# Patient Record
Sex: Male | Born: 1955
Health system: Southern US, Community
[De-identification: ages and names within clinical notes are randomized; demographics above are authoritative.]

## PROBLEM LIST (undated history)

## (undated) DIAGNOSIS — F32A Depression, unspecified: Secondary | ICD-10-CM

## (undated) DIAGNOSIS — F419 Anxiety disorder, unspecified: Secondary | ICD-10-CM

## (undated) DIAGNOSIS — F329 Major depressive disorder, single episode, unspecified: Secondary | ICD-10-CM

## (undated) DIAGNOSIS — N289 Disorder of kidney and ureter, unspecified: Secondary | ICD-10-CM

## (undated) HISTORY — PX: SHOULDER SURGERY: SHX246

---

## 2008-02-02 ENCOUNTER — Emergency Department (HOSPITAL_COMMUNITY): Admission: EM | Admit: 2008-02-02 | Discharge: 2008-02-02 | Payer: Self-pay | Admitting: Emergency Medicine

## 2008-02-09 ENCOUNTER — Emergency Department (HOSPITAL_COMMUNITY): Admission: EM | Admit: 2008-02-09 | Discharge: 2008-02-09 | Payer: Self-pay | Admitting: Emergency Medicine

## 2008-09-04 ENCOUNTER — Encounter: Admission: RE | Admit: 2008-09-04 | Discharge: 2008-09-04 | Payer: Self-pay | Admitting: Neurological Surgery

## 2008-11-06 ENCOUNTER — Emergency Department (HOSPITAL_COMMUNITY): Admission: EM | Admit: 2008-11-06 | Discharge: 2008-11-06 | Payer: Self-pay | Admitting: Emergency Medicine

## 2009-02-25 ENCOUNTER — Encounter: Admission: RE | Admit: 2009-02-25 | Discharge: 2009-02-25 | Payer: Self-pay | Admitting: Orthopedic Surgery

## 2009-08-16 ENCOUNTER — Inpatient Hospital Stay (HOSPITAL_COMMUNITY): Admission: RE | Admit: 2009-08-16 | Discharge: 2009-08-18 | Payer: Self-pay | Admitting: Orthopedic Surgery

## 2010-07-15 IMAGING — CR DG CHEST 2V
2 series · 2 of 2 positions shown · non-contrast
Comparison: Chest radiograph performed 02/02/2008

CLINICAL DATA: Drop in O2 saturation to 70s when sleeping.

CHEST - 2 VIEW

[w chest pa]
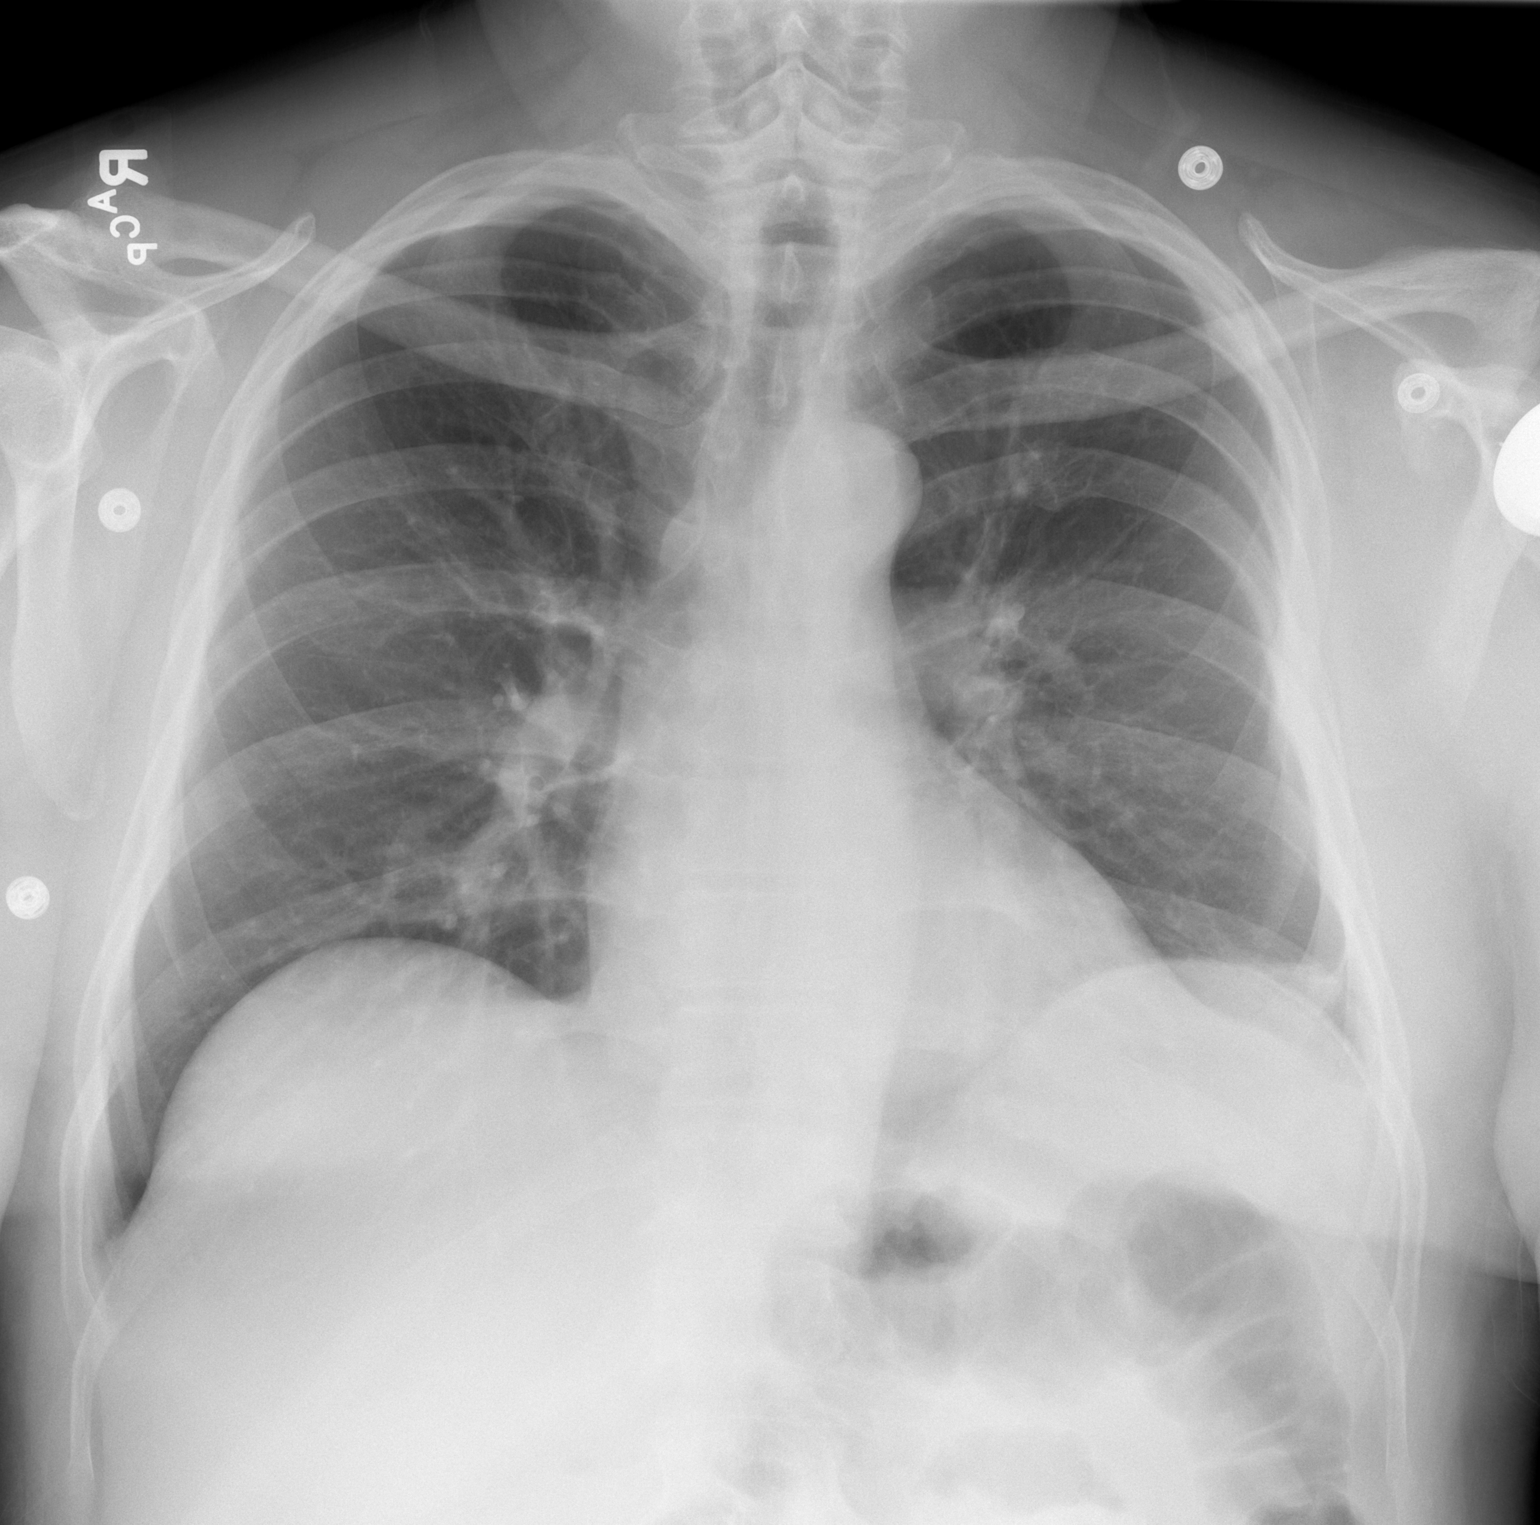

[w chest lat]
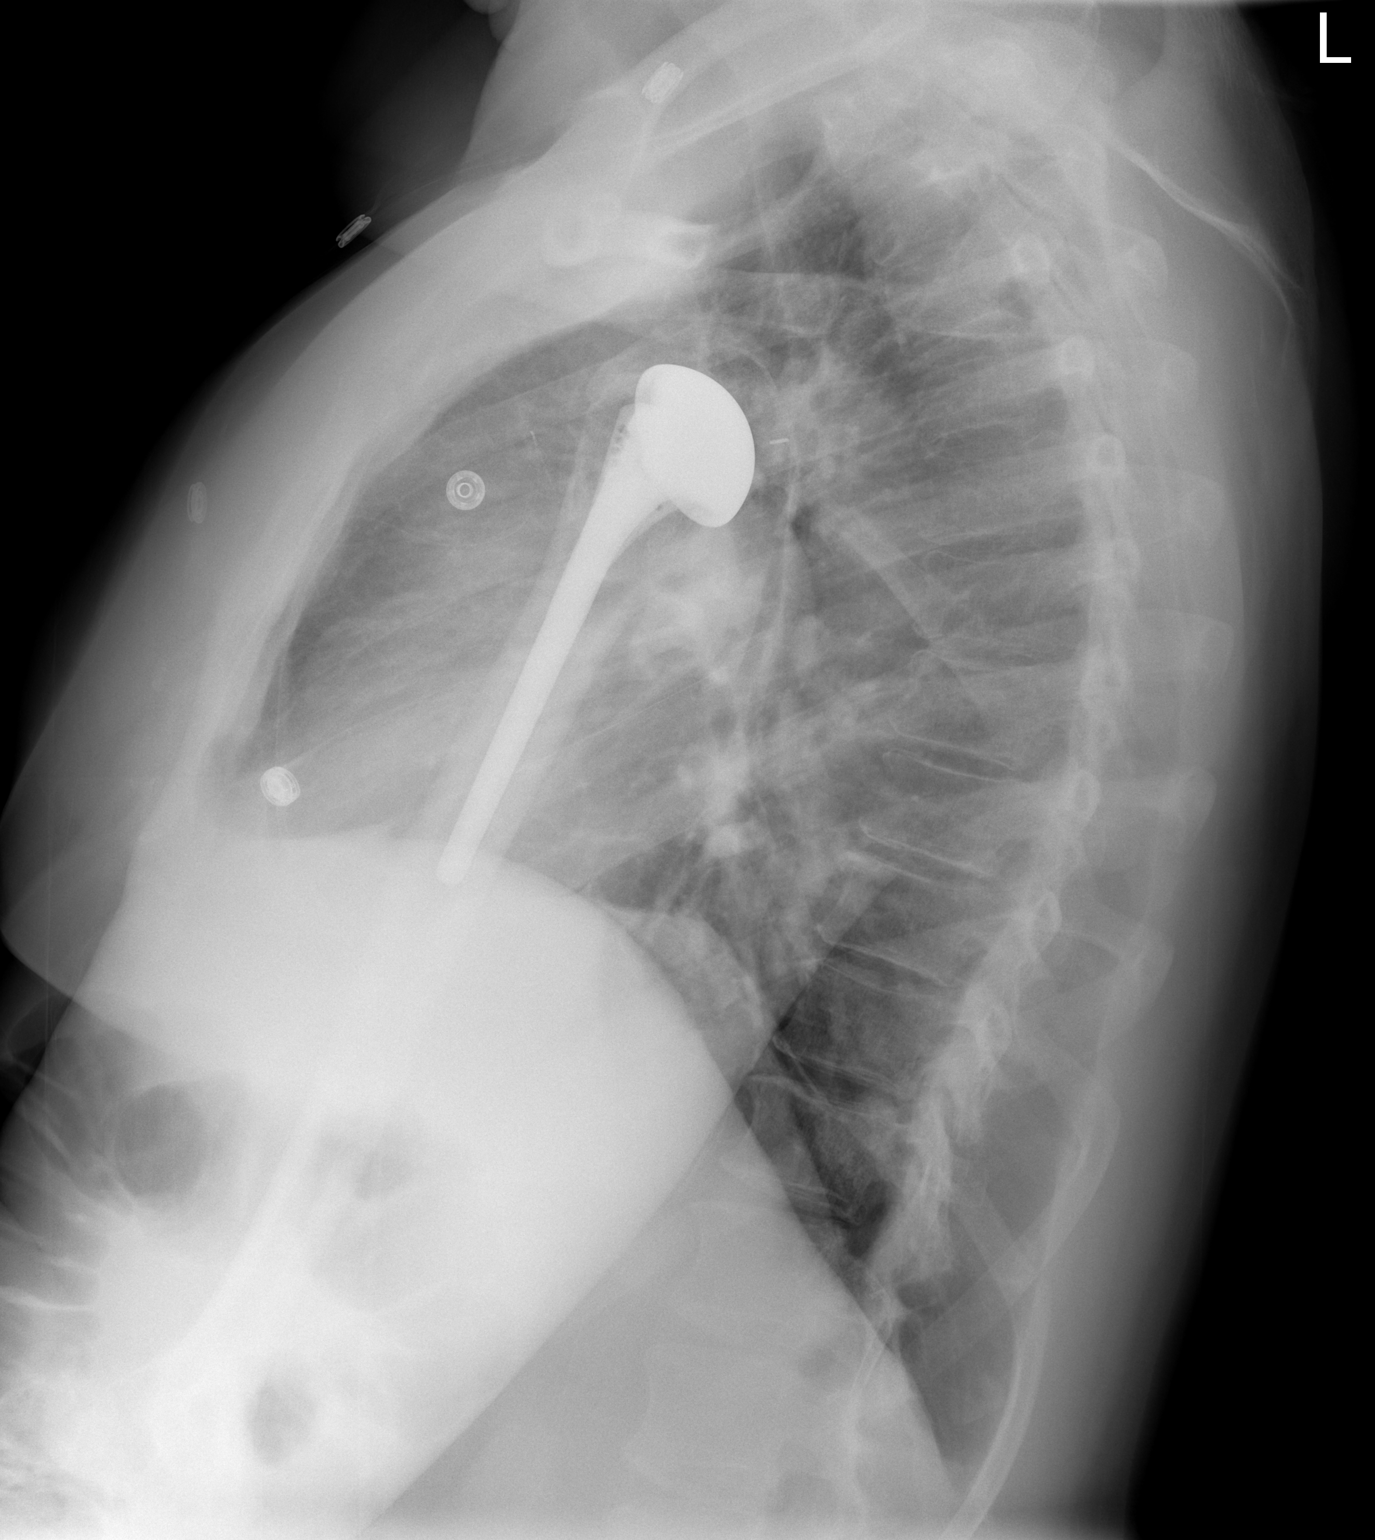

[2 of 2 positions shown; findings below may reference images not displayed]

FINDINGS: Mild left basilar atelectasis or scarring is noted.  The
lungs are otherwise clear.  No focal consolidation, pleural
effusion or pneumothorax is seen.

The cardiomediastinal silhouette is stable in appearance.  No acute
osseous abnormalities are identified.  The patient's left shoulder
arthroplasty is noted in expected position.
IMPRESSION: Mild left basilar atelectasis or scarring; the lungs are otherwise
clear.

## 2010-09-10 ENCOUNTER — Emergency Department (HOSPITAL_COMMUNITY): Admission: EM | Admit: 2010-09-10 | Discharge: 2010-09-10 | Payer: Self-pay | Admitting: Emergency Medicine

## 2011-04-04 LAB — DIFFERENTIAL
Eosinophils Absolute: 0.1 10*3/uL (ref 0.0–0.7)
Lymphocytes Relative: 35 % (ref 12–46)
Lymphs Abs: 2.7 10*3/uL (ref 0.7–4.0)
Monocytes Relative: 7 % (ref 3–12)
Neutro Abs: 4.5 10*3/uL (ref 1.7–7.7)
Neutrophils Relative %: 56 % (ref 43–77)

## 2011-04-04 LAB — URINALYSIS, ROUTINE W REFLEX MICROSCOPIC
Bilirubin Urine: NEGATIVE
Glucose, UA: NEGATIVE mg/dL
Ketones, ur: NEGATIVE mg/dL
Nitrite: NEGATIVE
Nitrite: NEGATIVE
Protein, ur: NEGATIVE mg/dL
Protein, ur: NEGATIVE mg/dL
Urobilinogen, UA: 0.2 mg/dL (ref 0.0–1.0)
Urobilinogen, UA: 0.2 mg/dL (ref 0.0–1.0)

## 2011-04-04 LAB — APTT: aPTT: 26 seconds (ref 24–37)

## 2011-04-04 LAB — TYPE AND SCREEN: Antibody Screen: NEGATIVE

## 2011-04-04 LAB — CBC
HCT: 33.2 % — ABNORMAL LOW (ref 39.0–52.0)
Hemoglobin: 11.8 g/dL — ABNORMAL LOW (ref 13.0–17.0)
MCHC: 35.5 g/dL (ref 30.0–36.0)
Platelets: 151 10*3/uL (ref 150–400)
Platelets: 210 10*3/uL (ref 150–400)
RBC: 5.11 MIL/uL (ref 4.22–5.81)
RDW: 13.3 % (ref 11.5–15.5)
WBC: 7.9 10*3/uL (ref 4.0–10.5)

## 2011-04-04 LAB — BASIC METABOLIC PANEL
BUN: 12 mg/dL (ref 6–23)
BUN: 12 mg/dL (ref 6–23)
CO2: 28 mEq/L (ref 19–32)
Calcium: 7.7 mg/dL — ABNORMAL LOW (ref 8.4–10.5)
Calcium: 9.1 mg/dL (ref 8.4–10.5)
Chloride: 104 mEq/L (ref 96–112)
Creatinine, Ser: 1.43 mg/dL (ref 0.4–1.5)
GFR calc Af Amer: >60 mL/min (ref >60)
GFR calc non Af Amer: 52 mL/min — ABNORMAL LOW (ref >60)
GFR calc non Af Amer: 55 mL/min — ABNORMAL LOW (ref >60)
Glucose, Bld: 137 mg/dL — ABNORMAL HIGH (ref 70–99)
Sodium: 138 mEq/L (ref 135–145)

## 2011-04-04 LAB — PROTIME-INR
INR: 1 (ref 0.00–1.49)
Prothrombin Time: 12.8 seconds (ref 11.6–15.2)

## 2011-05-12 NOTE — H&P (Signed)
Edward Jacobson, Edward Jacobson                 ACCOUNT NO.:  [id number]   MEDICAL RECORD NO.:  [id number]          PATIENT TYPE:  INP   LOCATION:  NA                           FACILITY:  [facility]   PHYSICIAN:  [name], [name] DATE OF BIRTH:  1956-06-17   DATE OF ADMISSION:  DATE OF DISCHARGE:                              HISTORY & PHYSICAL   CHIEF COMPLAINTS:  Left shoulder pain.   HISTORY OF PRESENT ILLNESS:  The patient is a 55 year old male with a  painful left shoulder hemiarthroplasty and stiffness.  The patient has  elected to have a left shoulder revision arthroplasty and/or scar  removal.   PAST MEDICAL HISTORY:  Depression.   FAMILY HISTORY:  Negative.   SOCIAL HISTORY:  The patient of Dr. Reuel Boom, does not smoke or drink.   ALLERGIES:  None.   CURRENT MEDICATIONS:  Tramadol 50 mg 1 tablet q.4-6 h. p.r.n. pain.   REVIEW OF SYSTEMS:  Pain on range of motion left shoulder.  Otherwise,  negative.   PHYSICAL EXAMINATION:  VITAL SIGNS:  Pulse 80, respirations 18, blood  pressure 160/88.  GENERAL:  The patient is a healthy-appearing 55 year old male in no  acute distress.  Pleasant mood and affect.  Alert and oriented x3.  NEUROLOGIC:  Cranial nerves II-XII are grossly intact.  He has full  range of motion without any tenderness of the cervical or thoracic  spine.  Chest has active breath sounds bilaterally.  No wheeze, rhonchi  or rales.  ABDOMEN:  Nontender, nondistended with active bowel sounds.  HEART:  Regular rate and rhythm.  No murmur.  ABDOMEN:  Nontender.  EXTREMITIES:  Moderate tenderness in left shoulder with range of motion  and decreased range of motion secondary to lack of strength.  SKIN:  No rashes or edema.   X-rays show left shoulder previous hemiarthroplasty.   IMPRESSION:  Painful left shoulder hemiarthroplasty.   PLAN OF ACTION:  Have a revision shoulder arthroplasty and scar removal  by Dr. Malon Kindle.      Jessey B. Durwin Nora, P.A.      [name], [name]  Electronically Signed    TBD/MEDQ  D:  08/07/2009  T:  08/08/2009  Job:  [id number]

## 2011-05-12 NOTE — Op Note (Signed)
[name], [name]                 ACCOUNT NO.:  [id number]   MEDICAL RECORD NO.:  [id number]          PATIENT TYPE:  INP   LOCATION:  5014                         FACILITY:  [facility]   PHYSICIAN:  [name], [name] DATE OF BIRTH:  [birth year]   DATE OF PROCEDURE:  08/16/2009  DATE OF DISCHARGE:                               OPERATIVE REPORT   PREOPERATIVE DIAGNOSIS:  Left shoulder pain following humeral head  resurfacing.   POSTOPERATIVE DIAGNOSIS:  Left shoulder pain following humeral head  resurfacing.   PROCEDURE PERFORMED:  Left shoulder revision total shoulder arthroplasty  using DePuy Global Advantage System.   SURGEON:  [name], MD   ASSISTANT:  Donnie Coffin. Dixon, PA-C   ANESTHESIA:  General anesthesia plus interscalene block anesthesia was  used.   ESTIMATED BLOOD LOSS:  150 mL.   URINE OUTPUT:  900 mL.   FLUID REPLACEMENT:  2500 mL crystalloid.   INSTRUMENT COUNTS:  Correct.   COMPLICATIONS:  None.   Perioperative antibiotics were given.   INDICATIONS:  The patient is a 55 year old male with worsening left  shoulder pain following a humeral head resurfacing for shoulder  arthritis.  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  Following humeral head resurfacing, the patient's pain  did not significantly improve and his range of motion was limited.  He  complained of functional loss and persistent pain desiring conversion to  total shoulder arthroplasty in attempt to improve range of motion and  eliminate pain.  Risks and benefits of this plan were discussed with him  and he wanted to proceed.  Informed consent obtained.   DESCRIPTION OF PROCEDURE:  After an adequate level of anesthesia  achieved, the patient positioned in modified beach-chair position.  Left  shoulder examined  under anesthesia.  The patient's forward elevation  passively was about 95 to 100 degrees.  External rotation was about 20  degrees, internal rotation was about 20 degrees.  Following sterile prep  and drape of the shoulder and the arm, we entered the shoulder using the  patient's prior deltopectoral approach.  Dissection down through the  subcutaneous tissues deltopectoral interval identified and divided using  a Bovie knife.  At this point, we went ahead and developed a plane  between the deltoid muscle and the humerus, as well as the subscapularis  underneath the conjoined tendon.  We freed all that up.  There was quite  a bit of scar tissue in this plane and once that was freed up the motion  had improved a little bit and we went ahead then and took the subscap  off the lesser tuberosity sharply using a Bovie and then split the  rotator interval.  We placed #2 FiberWire sutures and traction sutures  into the free end of the rotator up to the subscapularis.  We released  the soft tissue off the humeral neck progressively externally rotating  the humerus.  We  then used the flexible osteotomes to remove the global  cap prosthesis.  Fortunately, we did not have significant bone loss upon  removal of the cap. We made a finishing cut using the neck cut guide for  the Global Advantage System resecting right up to the rotator cuff, that  was done with the elbow at the patient's side and the forearm externally  rotated about 10-15 degrees for some retroversion on the humeral side.  We then prepared the humerus with sequential reaming up to a size 10 and  then broached up to a size 10 humeral stem.  We were happy with the  version and happy that we could fully seat the stem.  At this point, we  subluxed the humerus posteriorly placed retractors and then visualized  360 degrees of the glenoid removing soft tissue and labrum from around  the glenoid.  There was no significant bone loss from the  glenoid but  quite a bit of chondromalacia noted and some degenerative labral  tearing.  We then noticed a large posterior glenoid osteophyte which was  removed and freed up from surrounding soft tissues.  This measured at  least 2 cm in anterior-posterior dimension and probably 3 cm in its  superior inferior dimension.  We removed the inferior capsule sharply  using the Bovie with care directed towards protecting the axillary nerve  which was palpated throughout during this portion of procedure.  We  placed scapular neck retractor, we then placed a Fukuda retractor over  the posterior glenoid to retract the humerus posteriorly.  At this  point, we were extremely pleased with our visualization of the glenoid.  We went and placed our central drill hole.  We reamed for a 44.  This  was left to size.  We felt the size to be between 44-48 but we selected  a 44 as we knew that after reaming we would have appropriate support.  Once the 44 we reamed down to the subchondral bone.  We then widened our  central drill hole for the anchor peg glenoid and then placed our  peripheral drill holes referencing off the inferior scapular pillar and  the superior glenoid tubercle for north-south direction and also marked  the 3 o'clock and 9 o'clock positions.  With the drill holes placed for  anchor peg glenoid, we placed a 44 trial anchor peg glenoid.  This fit  perfectly with excellent support.  We then placed a 48 x 18 eccentric  humeral head which gave perfect coverage over the humerus and then  reduced the shoulder.  We had excellent soft tissue balancing with  ability to translate the humerus posteriorly 50% and inferiorly 50%.  We  removed all trial components.  We then placed #2 FiberWire suture  through drill holes in the lesser tuberosity for repairing the subscap.  We then impaction grafted the 10 humeral stem in place with some  available bone graft to assist in the metaphyseal fixation.  We  had  excellent purchase and fixation and full seating of the size 10 humeral  stem.  We then went ahead and subluxed the humerus again posteriorly, we  removed the trial glenoid and placed some epi soaked Gelfoam in the  peripheral holes and then went ahead and then cemented the anchor peg  glenoid into place.  We used a Toomey syringe and cemented in on the  peripheral holes and not the central hole, placed the real 44 anchor peg  glenoid by [employer]  in place and held until the cement was hard, checked  for any excess cement, any loose bodies and none was encountered.  I  checked the nerve one more time and it was in good shape, placed the  real 48 x 18 eccentric head and placed out posterior superiorly,  impacted that onto the trunnion.  We were happy with that security,  reduced the shoulder.  We had no difficulty repairing the subscapularis  anatomically through bone and also up through the rotator interval area  oversewing with #2 FiberWire suture.  We were able to externally rotate  only about 10 degrees due to tightness in the subscapularis, it was  completely freed from the undersurface of the coracoid and off the  humeral neck and from the undersurface of the conjoined tendon.  I just  felt like the entire musculotendinous unit was tight.  This should  stretch up.  The rotator cuff was freed from around, the coracohumeral  ligament was released and it was freed off the glenoid and from the  subacromial space and the subdeltoid space.  We felt like a potential is  there for external range of motion I could passively forward elevate the  patient all the way up to 180 degrees after subscap repair and internal  rotation was excellent as well so we feel like the motion is going to  improve significantly.  We then thoroughly irrigated.  We placed a drain  and  sewed the drain with 3-0 nylon and then closed the deltopectoral  interval with 0 Vicryl suture followed by 2-0 Vicryl  subcutaneous  closure and 4-0 Monocryl for the skin.  Steri-Strips were applied  followed by a sterile dressing.  The patient tolerated the surgery well.      [name], [name]  Electronically Signed     SRN/MEDQ  D:  08/16/2009  T:  08/17/2009  Job:  [id number]

## 2012-02-29 DIAGNOSIS — L219 Seborrheic dermatitis, unspecified: Secondary | ICD-10-CM | POA: Diagnosis not present

## 2012-02-29 DIAGNOSIS — M25519 Pain in unspecified shoulder: Secondary | ICD-10-CM | POA: Diagnosis not present

## 2012-02-29 DIAGNOSIS — S139XXA Sprain of joints and ligaments of unspecified parts of neck, initial encounter: Secondary | ICD-10-CM | POA: Diagnosis not present

## 2012-02-29 DIAGNOSIS — M545 Low back pain: Secondary | ICD-10-CM | POA: Diagnosis not present

## 2012-04-07 DIAGNOSIS — M545 Low back pain: Secondary | ICD-10-CM | POA: Diagnosis not present

## 2012-04-07 DIAGNOSIS — I10 Essential (primary) hypertension: Secondary | ICD-10-CM | POA: Diagnosis not present

## 2012-07-14 DIAGNOSIS — M25519 Pain in unspecified shoulder: Secondary | ICD-10-CM | POA: Diagnosis not present

## 2012-07-14 DIAGNOSIS — N400 Enlarged prostate without lower urinary tract symptoms: Secondary | ICD-10-CM | POA: Diagnosis not present

## 2012-07-14 DIAGNOSIS — E7800 Pure hypercholesterolemia, unspecified: Secondary | ICD-10-CM | POA: Diagnosis not present

## 2012-07-14 DIAGNOSIS — I10 Essential (primary) hypertension: Secondary | ICD-10-CM | POA: Diagnosis not present

## 2012-07-21 DIAGNOSIS — Z0000 Encounter for general adult medical examination without abnormal findings: Secondary | ICD-10-CM | POA: Diagnosis not present

## 2012-07-21 DIAGNOSIS — I10 Essential (primary) hypertension: Secondary | ICD-10-CM | POA: Diagnosis not present

## 2012-07-21 DIAGNOSIS — Z23 Encounter for immunization: Secondary | ICD-10-CM | POA: Diagnosis not present

## 2012-07-21 DIAGNOSIS — M25519 Pain in unspecified shoulder: Secondary | ICD-10-CM | POA: Diagnosis not present

## 2012-07-28 DIAGNOSIS — M25519 Pain in unspecified shoulder: Secondary | ICD-10-CM | POA: Diagnosis not present

## 2012-07-28 DIAGNOSIS — M545 Low back pain: Secondary | ICD-10-CM | POA: Diagnosis not present

## 2012-09-02 DIAGNOSIS — L570 Actinic keratosis: Secondary | ICD-10-CM | POA: Diagnosis not present

## 2012-09-02 DIAGNOSIS — I10 Essential (primary) hypertension: Secondary | ICD-10-CM | POA: Diagnosis not present

## 2012-09-02 DIAGNOSIS — N529 Male erectile dysfunction, unspecified: Secondary | ICD-10-CM | POA: Diagnosis not present

## 2012-10-03 DIAGNOSIS — N529 Male erectile dysfunction, unspecified: Secondary | ICD-10-CM | POA: Diagnosis not present

## 2012-10-15 DIAGNOSIS — J019 Acute sinusitis, unspecified: Secondary | ICD-10-CM | POA: Diagnosis not present

## 2012-10-20 DIAGNOSIS — N529 Male erectile dysfunction, unspecified: Secondary | ICD-10-CM | POA: Diagnosis not present

## 2012-11-04 DIAGNOSIS — N529 Male erectile dysfunction, unspecified: Secondary | ICD-10-CM | POA: Diagnosis not present

## 2012-11-04 DIAGNOSIS — L570 Actinic keratosis: Secondary | ICD-10-CM | POA: Diagnosis not present

## 2012-11-04 DIAGNOSIS — I10 Essential (primary) hypertension: Secondary | ICD-10-CM | POA: Diagnosis not present

## 2012-11-18 DIAGNOSIS — N529 Male erectile dysfunction, unspecified: Secondary | ICD-10-CM | POA: Diagnosis not present

## 2012-12-02 DIAGNOSIS — N529 Male erectile dysfunction, unspecified: Secondary | ICD-10-CM | POA: Diagnosis not present

## 2012-12-16 DIAGNOSIS — N529 Male erectile dysfunction, unspecified: Secondary | ICD-10-CM | POA: Diagnosis not present

## 2012-12-30 DIAGNOSIS — N529 Male erectile dysfunction, unspecified: Secondary | ICD-10-CM | POA: Diagnosis not present

## 2013-01-26 DIAGNOSIS — L570 Actinic keratosis: Secondary | ICD-10-CM | POA: Diagnosis not present

## 2013-01-26 DIAGNOSIS — I10 Essential (primary) hypertension: Secondary | ICD-10-CM | POA: Diagnosis not present

## 2013-01-26 DIAGNOSIS — N529 Male erectile dysfunction, unspecified: Secondary | ICD-10-CM | POA: Diagnosis not present

## 2013-01-26 DIAGNOSIS — R5381 Other malaise: Secondary | ICD-10-CM | POA: Diagnosis not present

## 2013-01-27 DIAGNOSIS — N529 Male erectile dysfunction, unspecified: Secondary | ICD-10-CM | POA: Diagnosis not present

## 2013-02-01 DIAGNOSIS — N529 Male erectile dysfunction, unspecified: Secondary | ICD-10-CM | POA: Diagnosis not present

## 2013-02-01 DIAGNOSIS — I10 Essential (primary) hypertension: Secondary | ICD-10-CM | POA: Diagnosis not present

## 2013-02-14 DIAGNOSIS — N529 Male erectile dysfunction, unspecified: Secondary | ICD-10-CM | POA: Diagnosis not present

## 2013-02-28 DIAGNOSIS — N529 Male erectile dysfunction, unspecified: Secondary | ICD-10-CM | POA: Diagnosis not present

## 2013-03-14 DIAGNOSIS — N529 Male erectile dysfunction, unspecified: Secondary | ICD-10-CM | POA: Diagnosis not present

## 2013-03-27 DIAGNOSIS — N529 Male erectile dysfunction, unspecified: Secondary | ICD-10-CM | POA: Diagnosis not present

## 2013-04-10 DIAGNOSIS — N529 Male erectile dysfunction, unspecified: Secondary | ICD-10-CM | POA: Diagnosis not present

## 2013-04-24 DIAGNOSIS — N529 Male erectile dysfunction, unspecified: Secondary | ICD-10-CM | POA: Diagnosis not present

## 2013-05-15 DIAGNOSIS — N529 Male erectile dysfunction, unspecified: Secondary | ICD-10-CM | POA: Diagnosis not present

## 2013-05-29 DIAGNOSIS — I10 Essential (primary) hypertension: Secondary | ICD-10-CM | POA: Diagnosis not present

## 2013-05-29 DIAGNOSIS — N529 Male erectile dysfunction, unspecified: Secondary | ICD-10-CM | POA: Diagnosis not present

## 2013-06-02 DIAGNOSIS — IMO0002 Reserved for concepts with insufficient information to code with codable children: Secondary | ICD-10-CM | POA: Diagnosis not present

## 2013-06-02 DIAGNOSIS — I10 Essential (primary) hypertension: Secondary | ICD-10-CM | POA: Diagnosis not present

## 2013-06-02 DIAGNOSIS — R5383 Other fatigue: Secondary | ICD-10-CM | POA: Diagnosis not present

## 2013-06-02 DIAGNOSIS — R5381 Other malaise: Secondary | ICD-10-CM | POA: Diagnosis not present

## 2013-06-06 DIAGNOSIS — R5383 Other fatigue: Secondary | ICD-10-CM | POA: Diagnosis not present

## 2013-06-06 DIAGNOSIS — N529 Male erectile dysfunction, unspecified: Secondary | ICD-10-CM | POA: Diagnosis not present

## 2013-06-06 DIAGNOSIS — I10 Essential (primary) hypertension: Secondary | ICD-10-CM | POA: Diagnosis not present

## 2013-06-06 DIAGNOSIS — R5381 Other malaise: Secondary | ICD-10-CM | POA: Diagnosis not present

## 2013-06-06 DIAGNOSIS — M545 Low back pain: Secondary | ICD-10-CM | POA: Diagnosis not present

## 2013-06-06 DIAGNOSIS — IMO0002 Reserved for concepts with insufficient information to code with codable children: Secondary | ICD-10-CM | POA: Diagnosis not present

## 2013-06-12 DIAGNOSIS — N529 Male erectile dysfunction, unspecified: Secondary | ICD-10-CM | POA: Diagnosis not present

## 2013-06-26 DIAGNOSIS — N529 Male erectile dysfunction, unspecified: Secondary | ICD-10-CM | POA: Diagnosis not present

## 2013-07-11 DIAGNOSIS — N529 Male erectile dysfunction, unspecified: Secondary | ICD-10-CM | POA: Diagnosis not present

## 2013-07-25 DIAGNOSIS — N529 Male erectile dysfunction, unspecified: Secondary | ICD-10-CM | POA: Diagnosis not present

## 2013-08-08 DIAGNOSIS — N529 Male erectile dysfunction, unspecified: Secondary | ICD-10-CM | POA: Diagnosis not present

## 2013-08-23 DIAGNOSIS — N529 Male erectile dysfunction, unspecified: Secondary | ICD-10-CM | POA: Diagnosis not present

## 2013-09-05 DIAGNOSIS — N529 Male erectile dysfunction, unspecified: Secondary | ICD-10-CM | POA: Diagnosis not present

## 2013-09-13 DIAGNOSIS — R5381 Other malaise: Secondary | ICD-10-CM | POA: Diagnosis not present

## 2013-09-13 DIAGNOSIS — M545 Low back pain: Secondary | ICD-10-CM | POA: Diagnosis not present

## 2013-09-13 DIAGNOSIS — IMO0002 Reserved for concepts with insufficient information to code with codable children: Secondary | ICD-10-CM | POA: Diagnosis not present

## 2013-09-13 DIAGNOSIS — Z23 Encounter for immunization: Secondary | ICD-10-CM | POA: Diagnosis not present

## 2013-09-13 DIAGNOSIS — L219 Seborrheic dermatitis, unspecified: Secondary | ICD-10-CM | POA: Diagnosis not present

## 2013-09-13 DIAGNOSIS — I10 Essential (primary) hypertension: Secondary | ICD-10-CM | POA: Diagnosis not present

## 2013-09-13 DIAGNOSIS — N529 Male erectile dysfunction, unspecified: Secondary | ICD-10-CM | POA: Diagnosis not present

## 2013-09-19 DIAGNOSIS — N529 Male erectile dysfunction, unspecified: Secondary | ICD-10-CM | POA: Diagnosis not present

## 2013-10-03 DIAGNOSIS — N529 Male erectile dysfunction, unspecified: Secondary | ICD-10-CM | POA: Diagnosis not present

## 2013-10-17 DIAGNOSIS — N529 Male erectile dysfunction, unspecified: Secondary | ICD-10-CM | POA: Diagnosis not present

## 2013-10-31 DIAGNOSIS — N529 Male erectile dysfunction, unspecified: Secondary | ICD-10-CM | POA: Diagnosis not present

## 2013-11-14 DIAGNOSIS — N529 Male erectile dysfunction, unspecified: Secondary | ICD-10-CM | POA: Diagnosis not present

## 2013-11-21 DIAGNOSIS — I10 Essential (primary) hypertension: Secondary | ICD-10-CM | POA: Diagnosis not present

## 2013-11-21 DIAGNOSIS — R5381 Other malaise: Secondary | ICD-10-CM | POA: Diagnosis not present

## 2013-11-21 DIAGNOSIS — N529 Male erectile dysfunction, unspecified: Secondary | ICD-10-CM | POA: Diagnosis not present

## 2013-11-28 DIAGNOSIS — M545 Low back pain: Secondary | ICD-10-CM | POA: Diagnosis not present

## 2013-11-28 DIAGNOSIS — Z1331 Encounter for screening for depression: Secondary | ICD-10-CM | POA: Diagnosis not present

## 2013-11-28 DIAGNOSIS — L570 Actinic keratosis: Secondary | ICD-10-CM | POA: Diagnosis not present

## 2013-11-28 DIAGNOSIS — R5381 Other malaise: Secondary | ICD-10-CM | POA: Diagnosis not present

## 2013-11-28 DIAGNOSIS — L219 Seborrheic dermatitis, unspecified: Secondary | ICD-10-CM | POA: Diagnosis not present

## 2013-11-28 DIAGNOSIS — N529 Male erectile dysfunction, unspecified: Secondary | ICD-10-CM | POA: Diagnosis not present

## 2013-11-28 DIAGNOSIS — IMO0002 Reserved for concepts with insufficient information to code with codable children: Secondary | ICD-10-CM | POA: Diagnosis not present

## 2013-11-28 DIAGNOSIS — I10 Essential (primary) hypertension: Secondary | ICD-10-CM | POA: Diagnosis not present

## 2013-12-12 DIAGNOSIS — N529 Male erectile dysfunction, unspecified: Secondary | ICD-10-CM | POA: Diagnosis not present

## 2013-12-26 DIAGNOSIS — L570 Actinic keratosis: Secondary | ICD-10-CM | POA: Diagnosis not present

## 2014-01-08 DIAGNOSIS — IMO0002 Reserved for concepts with insufficient information to code with codable children: Secondary | ICD-10-CM | POA: Diagnosis not present

## 2014-01-09 DIAGNOSIS — N529 Male erectile dysfunction, unspecified: Secondary | ICD-10-CM | POA: Diagnosis not present

## 2014-01-24 DIAGNOSIS — N529 Male erectile dysfunction, unspecified: Secondary | ICD-10-CM | POA: Diagnosis not present

## 2014-02-07 DIAGNOSIS — N529 Male erectile dysfunction, unspecified: Secondary | ICD-10-CM | POA: Diagnosis not present

## 2014-02-07 DIAGNOSIS — I10 Essential (primary) hypertension: Secondary | ICD-10-CM | POA: Diagnosis not present

## 2014-02-13 DIAGNOSIS — R5383 Other fatigue: Secondary | ICD-10-CM | POA: Diagnosis not present

## 2014-02-13 DIAGNOSIS — Z1331 Encounter for screening for depression: Secondary | ICD-10-CM | POA: Diagnosis not present

## 2014-02-13 DIAGNOSIS — L570 Actinic keratosis: Secondary | ICD-10-CM | POA: Diagnosis not present

## 2014-02-13 DIAGNOSIS — M545 Low back pain, unspecified: Secondary | ICD-10-CM | POA: Diagnosis not present

## 2014-02-13 DIAGNOSIS — I10 Essential (primary) hypertension: Secondary | ICD-10-CM | POA: Diagnosis not present

## 2014-02-13 DIAGNOSIS — R5381 Other malaise: Secondary | ICD-10-CM | POA: Diagnosis not present

## 2014-02-13 DIAGNOSIS — N529 Male erectile dysfunction, unspecified: Secondary | ICD-10-CM | POA: Diagnosis not present

## 2014-02-13 DIAGNOSIS — IMO0002 Reserved for concepts with insufficient information to code with codable children: Secondary | ICD-10-CM | POA: Diagnosis not present

## 2014-02-13 DIAGNOSIS — L219 Seborrheic dermatitis, unspecified: Secondary | ICD-10-CM | POA: Diagnosis not present

## 2014-02-16 DIAGNOSIS — L570 Actinic keratosis: Secondary | ICD-10-CM | POA: Diagnosis not present

## 2014-02-16 DIAGNOSIS — IMO0002 Reserved for concepts with insufficient information to code with codable children: Secondary | ICD-10-CM | POA: Diagnosis not present

## 2014-02-21 DIAGNOSIS — N529 Male erectile dysfunction, unspecified: Secondary | ICD-10-CM | POA: Diagnosis not present

## 2014-02-28 DIAGNOSIS — N529 Male erectile dysfunction, unspecified: Secondary | ICD-10-CM | POA: Diagnosis not present

## 2014-03-03 DIAGNOSIS — F329 Major depressive disorder, single episode, unspecified: Secondary | ICD-10-CM | POA: Diagnosis not present

## 2014-03-03 DIAGNOSIS — F3289 Other specified depressive episodes: Secondary | ICD-10-CM | POA: Diagnosis not present

## 2014-03-06 DIAGNOSIS — N529 Male erectile dysfunction, unspecified: Secondary | ICD-10-CM | POA: Diagnosis not present

## 2014-03-08 DIAGNOSIS — L219 Seborrheic dermatitis, unspecified: Secondary | ICD-10-CM | POA: Diagnosis not present

## 2014-03-08 DIAGNOSIS — M545 Low back pain, unspecified: Secondary | ICD-10-CM | POA: Diagnosis not present

## 2014-03-08 DIAGNOSIS — L570 Actinic keratosis: Secondary | ICD-10-CM | POA: Diagnosis not present

## 2014-03-08 DIAGNOSIS — R5381 Other malaise: Secondary | ICD-10-CM | POA: Diagnosis not present

## 2014-03-08 DIAGNOSIS — F3289 Other specified depressive episodes: Secondary | ICD-10-CM | POA: Diagnosis not present

## 2014-03-08 DIAGNOSIS — R5383 Other fatigue: Secondary | ICD-10-CM | POA: Diagnosis not present

## 2014-03-08 DIAGNOSIS — Z79899 Other long term (current) drug therapy: Secondary | ICD-10-CM | POA: Diagnosis not present

## 2014-03-08 DIAGNOSIS — I10 Essential (primary) hypertension: Secondary | ICD-10-CM | POA: Diagnosis not present

## 2014-03-08 DIAGNOSIS — R45851 Suicidal ideations: Secondary | ICD-10-CM | POA: Diagnosis not present

## 2014-03-08 DIAGNOSIS — N529 Male erectile dysfunction, unspecified: Secondary | ICD-10-CM | POA: Diagnosis not present

## 2014-03-08 DIAGNOSIS — IMO0002 Reserved for concepts with insufficient information to code with codable children: Secondary | ICD-10-CM | POA: Diagnosis not present

## 2014-03-08 DIAGNOSIS — F329 Major depressive disorder, single episode, unspecified: Secondary | ICD-10-CM | POA: Diagnosis not present

## 2014-03-08 DIAGNOSIS — F489 Nonpsychotic mental disorder, unspecified: Secondary | ICD-10-CM | POA: Diagnosis not present

## 2014-03-08 DIAGNOSIS — Z1331 Encounter for screening for depression: Secondary | ICD-10-CM | POA: Diagnosis not present

## 2014-03-09 DIAGNOSIS — M549 Dorsalgia, unspecified: Secondary | ICD-10-CM | POA: Diagnosis present

## 2014-03-09 DIAGNOSIS — F339 Major depressive disorder, recurrent, unspecified: Secondary | ICD-10-CM | POA: Diagnosis not present

## 2014-03-09 DIAGNOSIS — M25519 Pain in unspecified shoulder: Secondary | ICD-10-CM | POA: Diagnosis present

## 2014-03-09 DIAGNOSIS — R45851 Suicidal ideations: Secondary | ICD-10-CM | POA: Diagnosis not present

## 2014-03-09 DIAGNOSIS — F959 Tic disorder, unspecified: Secondary | ICD-10-CM | POA: Diagnosis present

## 2014-03-09 DIAGNOSIS — F411 Generalized anxiety disorder: Secondary | ICD-10-CM | POA: Diagnosis present

## 2014-03-09 DIAGNOSIS — Z87828 Personal history of other (healed) physical injury and trauma: Secondary | ICD-10-CM | POA: Diagnosis not present

## 2014-03-09 DIAGNOSIS — G4700 Insomnia, unspecified: Secondary | ICD-10-CM | POA: Diagnosis present

## 2014-03-13 DIAGNOSIS — R5381 Other malaise: Secondary | ICD-10-CM | POA: Diagnosis not present

## 2014-03-13 DIAGNOSIS — L219 Seborrheic dermatitis, unspecified: Secondary | ICD-10-CM | POA: Diagnosis not present

## 2014-03-13 DIAGNOSIS — M545 Low back pain, unspecified: Secondary | ICD-10-CM | POA: Diagnosis not present

## 2014-03-13 DIAGNOSIS — IMO0002 Reserved for concepts with insufficient information to code with codable children: Secondary | ICD-10-CM | POA: Diagnosis not present

## 2014-03-13 DIAGNOSIS — L570 Actinic keratosis: Secondary | ICD-10-CM | POA: Diagnosis not present

## 2014-03-13 DIAGNOSIS — N529 Male erectile dysfunction, unspecified: Secondary | ICD-10-CM | POA: Diagnosis not present

## 2014-03-13 DIAGNOSIS — I10 Essential (primary) hypertension: Secondary | ICD-10-CM | POA: Diagnosis not present

## 2014-03-20 DIAGNOSIS — F332 Major depressive disorder, recurrent severe without psychotic features: Secondary | ICD-10-CM | POA: Diagnosis not present

## 2014-03-27 DIAGNOSIS — N529 Male erectile dysfunction, unspecified: Secondary | ICD-10-CM | POA: Diagnosis not present

## 2014-04-23 DIAGNOSIS — N529 Male erectile dysfunction, unspecified: Secondary | ICD-10-CM | POA: Diagnosis not present

## 2014-05-25 DIAGNOSIS — N529 Male erectile dysfunction, unspecified: Secondary | ICD-10-CM | POA: Diagnosis not present

## 2014-06-01 DIAGNOSIS — N529 Male erectile dysfunction, unspecified: Secondary | ICD-10-CM | POA: Diagnosis not present

## 2014-06-06 DIAGNOSIS — R5381 Other malaise: Secondary | ICD-10-CM | POA: Diagnosis not present

## 2014-06-06 DIAGNOSIS — I10 Essential (primary) hypertension: Secondary | ICD-10-CM | POA: Diagnosis not present

## 2014-06-06 DIAGNOSIS — M545 Low back pain, unspecified: Secondary | ICD-10-CM | POA: Diagnosis not present

## 2014-06-06 DIAGNOSIS — L219 Seborrheic dermatitis, unspecified: Secondary | ICD-10-CM | POA: Diagnosis not present

## 2014-06-06 DIAGNOSIS — IMO0002 Reserved for concepts with insufficient information to code with codable children: Secondary | ICD-10-CM | POA: Diagnosis not present

## 2014-06-06 DIAGNOSIS — N529 Male erectile dysfunction, unspecified: Secondary | ICD-10-CM | POA: Diagnosis not present

## 2014-06-06 DIAGNOSIS — R5383 Other fatigue: Secondary | ICD-10-CM | POA: Diagnosis not present

## 2014-06-06 DIAGNOSIS — L570 Actinic keratosis: Secondary | ICD-10-CM | POA: Diagnosis not present

## 2014-06-21 DIAGNOSIS — E291 Testicular hypofunction: Secondary | ICD-10-CM | POA: Diagnosis not present

## 2014-06-21 DIAGNOSIS — N529 Male erectile dysfunction, unspecified: Secondary | ICD-10-CM | POA: Diagnosis not present

## 2014-07-06 DIAGNOSIS — E291 Testicular hypofunction: Secondary | ICD-10-CM | POA: Diagnosis not present

## 2014-07-20 DIAGNOSIS — E291 Testicular hypofunction: Secondary | ICD-10-CM | POA: Diagnosis not present

## 2014-08-03 DIAGNOSIS — E291 Testicular hypofunction: Secondary | ICD-10-CM | POA: Diagnosis not present

## 2014-08-17 DIAGNOSIS — E291 Testicular hypofunction: Secondary | ICD-10-CM | POA: Diagnosis not present

## 2014-08-31 DIAGNOSIS — N529 Male erectile dysfunction, unspecified: Secondary | ICD-10-CM | POA: Diagnosis not present

## 2014-08-31 DIAGNOSIS — E291 Testicular hypofunction: Secondary | ICD-10-CM | POA: Diagnosis not present

## 2014-09-26 DIAGNOSIS — E291 Testicular hypofunction: Secondary | ICD-10-CM | POA: Diagnosis not present

## 2014-10-09 DIAGNOSIS — E291 Testicular hypofunction: Secondary | ICD-10-CM | POA: Diagnosis not present

## 2014-10-19 DIAGNOSIS — R5383 Other fatigue: Secondary | ICD-10-CM | POA: Diagnosis not present

## 2014-10-19 DIAGNOSIS — N529 Male erectile dysfunction, unspecified: Secondary | ICD-10-CM | POA: Diagnosis not present

## 2014-10-19 DIAGNOSIS — F329 Major depressive disorder, single episode, unspecified: Secondary | ICD-10-CM | POA: Diagnosis not present

## 2014-10-19 DIAGNOSIS — I10 Essential (primary) hypertension: Secondary | ICD-10-CM | POA: Diagnosis not present

## 2014-10-23 DIAGNOSIS — E291 Testicular hypofunction: Secondary | ICD-10-CM | POA: Diagnosis not present

## 2014-10-26 DIAGNOSIS — Z23 Encounter for immunization: Secondary | ICD-10-CM | POA: Diagnosis not present

## 2014-10-26 DIAGNOSIS — L570 Actinic keratosis: Secondary | ICD-10-CM | POA: Diagnosis not present

## 2014-10-26 DIAGNOSIS — Z9189 Other specified personal risk factors, not elsewhere classified: Secondary | ICD-10-CM | POA: Diagnosis not present

## 2014-10-26 DIAGNOSIS — E291 Testicular hypofunction: Secondary | ICD-10-CM | POA: Diagnosis not present

## 2014-10-26 DIAGNOSIS — Z1389 Encounter for screening for other disorder: Secondary | ICD-10-CM | POA: Diagnosis not present

## 2014-10-26 DIAGNOSIS — F331 Major depressive disorder, recurrent, moderate: Secondary | ICD-10-CM | POA: Diagnosis not present

## 2014-10-26 DIAGNOSIS — I10 Essential (primary) hypertension: Secondary | ICD-10-CM | POA: Diagnosis not present

## 2014-10-26 DIAGNOSIS — N529 Male erectile dysfunction, unspecified: Secondary | ICD-10-CM | POA: Diagnosis not present

## 2014-11-26 DIAGNOSIS — E291 Testicular hypofunction: Secondary | ICD-10-CM | POA: Diagnosis not present

## 2014-12-10 DIAGNOSIS — E291 Testicular hypofunction: Secondary | ICD-10-CM | POA: Diagnosis not present

## 2014-12-25 DIAGNOSIS — E291 Testicular hypofunction: Secondary | ICD-10-CM | POA: Diagnosis not present

## 2015-01-08 DIAGNOSIS — E291 Testicular hypofunction: Secondary | ICD-10-CM | POA: Diagnosis not present

## 2015-01-22 DIAGNOSIS — E291 Testicular hypofunction: Secondary | ICD-10-CM | POA: Diagnosis not present

## 2015-02-01 DIAGNOSIS — N529 Male erectile dysfunction, unspecified: Secondary | ICD-10-CM | POA: Diagnosis not present

## 2015-02-01 DIAGNOSIS — E291 Testicular hypofunction: Secondary | ICD-10-CM | POA: Diagnosis not present

## 2015-02-01 DIAGNOSIS — R5383 Other fatigue: Secondary | ICD-10-CM | POA: Diagnosis not present

## 2015-02-01 DIAGNOSIS — I10 Essential (primary) hypertension: Secondary | ICD-10-CM | POA: Diagnosis not present

## 2015-02-06 DIAGNOSIS — E291 Testicular hypofunction: Secondary | ICD-10-CM | POA: Diagnosis not present

## 2015-02-08 DIAGNOSIS — I10 Essential (primary) hypertension: Secondary | ICD-10-CM | POA: Diagnosis not present

## 2015-02-08 DIAGNOSIS — L570 Actinic keratosis: Secondary | ICD-10-CM | POA: Diagnosis not present

## 2015-02-08 DIAGNOSIS — E291 Testicular hypofunction: Secondary | ICD-10-CM | POA: Diagnosis not present

## 2015-02-08 DIAGNOSIS — N529 Male erectile dysfunction, unspecified: Secondary | ICD-10-CM | POA: Diagnosis not present

## 2015-02-08 DIAGNOSIS — F331 Major depressive disorder, recurrent, moderate: Secondary | ICD-10-CM | POA: Diagnosis not present

## 2015-02-22 DIAGNOSIS — E291 Testicular hypofunction: Secondary | ICD-10-CM | POA: Diagnosis not present

## 2015-04-03 DIAGNOSIS — E291 Testicular hypofunction: Secondary | ICD-10-CM | POA: Diagnosis not present

## 2015-04-17 DIAGNOSIS — E291 Testicular hypofunction: Secondary | ICD-10-CM | POA: Diagnosis not present

## 2015-04-24 ENCOUNTER — Emergency Department (HOSPITAL_COMMUNITY): Payer: Medicare Other

## 2015-04-24 ENCOUNTER — Encounter (HOSPITAL_COMMUNITY): Payer: Self-pay | Admitting: Emergency Medicine

## 2015-04-24 ENCOUNTER — Emergency Department (HOSPITAL_COMMUNITY)
Admission: EM | Admit: 2015-04-24 | Discharge: 2015-04-24 | Disposition: A | Discharge status: Home / Self Care | Payer: Medicare Other | Attending: Emergency Medicine | Admitting: Emergency Medicine

## 2015-04-24 DIAGNOSIS — R1030 Lower abdominal pain, unspecified: Secondary | ICD-10-CM | POA: Diagnosis present

## 2015-04-24 DIAGNOSIS — N200 Calculus of kidney: Secondary | ICD-10-CM | POA: Insufficient documentation

## 2015-04-24 DIAGNOSIS — Z79899 Other long term (current) drug therapy: Secondary | ICD-10-CM | POA: Diagnosis not present

## 2015-04-24 DIAGNOSIS — N201 Calculus of ureter: Secondary | ICD-10-CM | POA: Diagnosis not present

## 2015-04-24 DIAGNOSIS — R1031 Right lower quadrant pain: Secondary | ICD-10-CM | POA: Diagnosis not present

## 2015-04-24 DIAGNOSIS — R109 Unspecified abdominal pain: Secondary | ICD-10-CM

## 2015-04-24 DIAGNOSIS — N1330 Unspecified hydronephrosis: Secondary | ICD-10-CM | POA: Diagnosis not present

## 2015-04-24 HISTORY — DX: Disorder of kidney and ureter, unspecified: N28.9

## 2015-04-24 LAB — BASIC METABOLIC PANEL
Anion gap: 8 (ref 5–15)
BUN: 13 mg/dL (ref 6–23)
CHLORIDE: 106 mmol/L (ref 96–112)
CO2: 27 mmol/L (ref 19–32)
CREATININE: 1.46 mg/dL — AB (ref 0.50–1.35)
Calcium: 8 mg/dL — ABNORMAL LOW (ref 8.4–10.5)
GFR calc Af Amer: 59 mL/min — ABNORMAL LOW (ref >90)
GFR calc non Af Amer: 51 mL/min — ABNORMAL LOW (ref >90)
GLUCOSE: 101 mg/dL — AB (ref 70–99)
POTASSIUM: 3 mmol/L — AB (ref 3.5–5.1)
Sodium: 141 mmol/L (ref 135–145)

## 2015-04-24 LAB — CBC WITH DIFFERENTIAL/PLATELET
BASOS PCT: 0 % (ref 0–1)
Basophils Absolute: 0 10*3/uL (ref 0.0–0.1)
EOS ABS: 0.2 10*3/uL (ref 0.0–0.7)
Eosinophils Relative: 2 % (ref 0–5)
HEMATOCRIT: 41.4 % (ref 39.0–52.0)
HEMOGLOBIN: 14.5 g/dL (ref 13.0–17.0)
LYMPHS ABS: 4.3 10*3/uL — AB (ref 0.7–4.0)
Lymphocytes Relative: 43 % (ref 12–46)
MCH: 29.5 pg (ref 26.0–34.0)
MCHC: 35 g/dL (ref 30.0–36.0)
MCV: 84.3 fL (ref 78.0–100.0)
MONO ABS: 0.7 10*3/uL (ref 0.1–1.0)
MONOS PCT: 7 % (ref 3–12)
NEUTROS ABS: 4.8 10*3/uL (ref 1.7–7.7)
Neutrophils Relative %: 48 % (ref 43–77)
Platelets: 185 10*3/uL (ref 150–400)
RBC: 4.91 MIL/uL (ref 4.22–5.81)
RDW: 13.3 % (ref 11.5–15.5)
WBC: 10 10*3/uL (ref 4.0–10.5)

## 2015-04-24 LAB — URINALYSIS, ROUTINE W REFLEX MICROSCOPIC
Bilirubin Urine: NEGATIVE
Glucose, UA: NEGATIVE mg/dL
Ketones, ur: NEGATIVE mg/dL
LEUKOCYTES UA: NEGATIVE
Nitrite: NEGATIVE
PROTEIN: NEGATIVE mg/dL
Specific Gravity, Urine: 1.02 (ref 1.005–1.030)
UROBILINOGEN UA: 0.2 mg/dL (ref 0.0–1.0)
pH: 6 (ref 5.0–8.0)

## 2015-04-24 LAB — URINE MICROSCOPIC-ADD ON

## 2015-04-24 MED ORDER — TAMSULOSIN HCL 0.4 MG PO CAPS: 0.4000 mg | ORAL_CAPSULE | Freq: Every day | ORAL | Status: DC

## 2015-04-24 MED ORDER — HYDROMORPHONE HCL 1 MG/ML IJ SOLN
1.0000 mg | INTRAMUSCULAR | Status: DC | PRN
  Administered 2015-04-24: 1 mg via INTRAVENOUS
  Filled 2015-04-24: qty 1

## 2015-04-24 MED ORDER — SODIUM CHLORIDE 0.9 % IV SOLN
Freq: Once | INTRAVENOUS | Status: DC
Start: 2015-04-24 — End: 2015-04-25

## 2015-04-24 MED ORDER — ONDANSETRON HCL 4 MG/2ML IJ SOLN
4.0000 mg | Freq: Once | INTRAMUSCULAR | Status: AC
  Administered 2015-04-24: 4 mg via INTRAVENOUS
  Filled 2015-04-24: qty 2

## 2015-04-24 MED ORDER — ONDANSETRON 4 MG PO TBDP: 4.0000 mg | ORAL_TABLET | Freq: Three times a day (TID) | ORAL | Status: DC | PRN

## 2015-04-24 MED ORDER — TAMSULOSIN HCL 0.4 MG PO CAPS: 0.4000 mg | ORAL_CAPSULE | Freq: Once | ORAL | Status: DC

## 2015-04-24 MED ORDER — OXYCODONE-ACETAMINOPHEN 5-325 MG PO TABS: 2.0000 | ORAL_TABLET | ORAL | Status: DC | PRN

## 2015-04-24 MED ORDER — SODIUM CHLORIDE 0.9 % IV BOLUS (SEPSIS)
500.0000 mL | Freq: Once | INTRAVENOUS | Status: AC
  Administered 2015-04-24: 500 mL via INTRAVENOUS

## 2015-04-24 NOTE — ED Notes (Signed)
Patient transported to CT 

## 2015-04-24 NOTE — ED Provider Notes (Addendum)
CSN: [id number]     Arrival date & time 04/24/15  1803 History   First MD Initiated Contact with Patient 04/24/15 1814     Chief Complaint  Patient presents with  . Flank Pain      HPI  Patient presents for evaluation of flank pain. Sudden onset right flank pain returning to his right lower abdomen and groin. Somewhat reminiscent of a kidney stone and 25 years ago. Sudden onset approximately 6 hours ago at home pain is unrelenting he presents here. Nausea but no vomiting. No dysuria or frequent hematuria.  Past Medical History  Diagnosis Date  . Renal disorder     kidneys stones   Past Surgical History  Procedure Laterality Date  . Shoulder surgery Left    History reviewed. No pertinent family history. History  Substance Use Topics  . Smoking status: Never Smoker   . Smokeless tobacco: Not on file  . Alcohol Use: No    Review of Systems  Constitutional: Negative for fever, chills, diaphoresis, appetite change and fatigue.  HENT: Negative for mouth sores, sore throat and trouble swallowing.   Eyes: Negative for visual disturbance.  Respiratory: Negative for cough, chest tightness, shortness of breath and wheezing.   Cardiovascular: Negative for chest pain.  Gastrointestinal: Positive for nausea and vomiting. Negative for abdominal pain, diarrhea and abdominal distention.  Endocrine: Negative for polydipsia, polyphagia and polyuria.  Genitourinary: Positive for flank pain. Negative for dysuria, frequency and hematuria.  Musculoskeletal: Negative for gait problem.  Skin: Negative for color change, pallor and rash.  Neurological: Negative for dizziness, syncope, light-headedness and headaches.  Hematological: Does not bruise/bleed easily.  Psychiatric/Behavioral: Negative for behavioral problems and confusion.      Allergies  Review of patient's allergies indicates no known allergies.  Home Medications   Prior to Admission medications   Medication Sig Start Date End  Date Taking? Authorizing Provider  buPROPion (WELLBUTRIN XL) 300 MG 24 hr tablet Take 300 mg by mouth daily.   Yes Historical Provider, MD  Fish Oil-Cholecalciferol (FISH OIL + D3 PO) Take 1 capsule by mouth daily.   Yes Historical Provider, MD  FLUoxetine (PROZAC) 20 MG capsule Take 60 mg by mouth daily.   Yes Historical Provider, MD  GARLIC PO Take 1 tablet by mouth daily.   Yes Historical Provider, MD  GINSENG PO Take 1 tablet by mouth daily.   Yes Historical Provider, MD  L-ARGININE PO Take 1 tablet by mouth daily.   Yes Historical Provider, MD  liothyronine (CYTOMEL) 25 MCG tablet Take 25 mcg by mouth daily.   Yes Historical Provider, MD  Multiple Vitamin (MULTIVITAMIN WITH MINERALS) TABS tablet Take 1 tablet by mouth daily.   Yes Historical Provider, MD  ondansetron (ZOFRAN ODT) 4 MG disintegrating tablet Take 1 tablet (4 mg total) by mouth every 8 (eight) hours as needed for nausea. 04/24/15   Tanna Furry, MD  oxyCODONE-acetaminophen (PERCOCET/ROXICET) 5-325 MG per tablet Take 2 tablets by mouth every 4 (four) hours as needed. 04/24/15   Tanna Furry, MD  tamsulosin (FLOMAX) 0.4 MG CAPS capsule Take 1 capsule (0.4 mg total) by mouth daily. 04/24/15   Tanna Furry, MD   BP 125/87 mmHg  Pulse 81  Temp(Src) 97.9 F (36.6 C) (Oral)  Resp 18  Ht 5\' 10"  (1.778 m)  Wt 205 lb (92.987 kg)  BMI 29.41 kg/m2  SpO2 94% Physical Exam  Constitutional: He is oriented to person, place, and time. He appears well-developed and well-nourished. No distress.  HENT:  Head: Normocephalic.  Eyes: Conjunctivae are normal. Pupils are equal, round, and reactive to light. No scleral icterus.  Neck: Normal range of motion. Neck supple. No thyromegaly present.  Cardiovascular: Normal rate and regular rhythm.  Exam reveals no gallop and no friction rub.   No murmur heard. Pulmonary/Chest: Effort normal and breath sounds normal. No respiratory distress. He has no wheezes. He has no rales.  Abdominal: Soft. Bowel sounds  are normal. He exhibits no distension. There is no tenderness. There is no rebound.  She appears uncomfortable. His abdomen is soft and benign without tenderness. Indicates right flank to right suprapubic abdominal pain.  Musculoskeletal: Normal range of motion.  Neurological: He is alert and oriented to person, place, and time.  Skin: Skin is warm and dry. No rash noted.  Psychiatric: He has a normal mood and affect. His behavior is normal.    ED Course  Procedures (including critical care time) Labs Review Labs Reviewed  BASIC METABOLIC PANEL - Abnormal; Notable for the following:    Potassium 3.0 (*)    Glucose, Bld 101 (*)    Creatinine, Ser 1.46 (*)    Calcium 8.0 (*)    GFR calc non Af Amer 51 (*)    GFR calc Af Amer 59 (*)    All other components within normal limits  CBC WITH DIFFERENTIAL/PLATELET - Abnormal; Notable for the following:    Lymphs Abs 4.3 (*)    All other components within normal limits  URINALYSIS, ROUTINE W REFLEX MICROSCOPIC - Abnormal; Notable for the following:    Hgb urine dipstick SMALL (*)    All other components within normal limits  URINE MICROSCOPIC-ADD ON    Imaging Review Ct Abdomen Pelvis Wo Contrast  04/24/2015   CLINICAL DATA:  Patient began having RT flank pain with urinary frequency around 1700 tonight.Hx renal stones  EXAM: CT ABDOMEN AND PELVIS WITHOUT CONTRAST  TECHNIQUE: Multidetector CT imaging of the abdomen and pelvis was performed following the standard protocol without IV contrast.  COMPARISON:  None.  FINDINGS: 2 mm stone projects along the luminal side on the right ureterovesicular junction. There is mild right hydroureteronephrosis and mild right perinephric stranding and right renal edema. No intrarenal stones. No renal masses. Left renal collecting system ureter are normal. No bladder mass or wall thickening.  Lung bases show minor dependent subsegmental atelectasis. Heart is normal in size.  Liver, spleen, gallbladder, pancreas,  adrenal glands:  Normal.  No adenopathy.  No abnormal fluid collections.  Scattered colonic diverticula. No diverticulitis. Colon otherwise unremarkable. Normal small bowel. Normal appendix.  Chronic bilateral pars defects at L5-S1 with a grade 1 to grade 2 anterolisthesis. Moderate-to-marked loss of disc height at L5-S1. No osteoblastic or osteolytic lesions.  IMPRESSION: 1. 2 mm stone at the right ureterovesicular junction causes mild right hydroureteronephrosis. 2. No other acute findings. 3. No intrarenal stones.   Electronically Signed   By: Lajean Manes [name]   On: 04/24/2015 19:31     EKG Interpretation None      MDM   Final diagnoses:  Flank pain  Kidney stone    Patient with a 2 mm UVJ stone. Creatinine 1.46. No infection. No leukocytes in the urine, no leukocytosis, no fever. Symptoms improved and controlled taking by mouth. Plan is home. Term precautions given. Urology follow-up 7 days with continued symptoms. Rest, stay hydrated, push fluids.  Tanna Furry, MD 04/24/15 2100  Tanna Furry, MD 04/24/15 2100  Tanna Furry, MD 04/24/15 2101

## 2015-04-24 NOTE — Discharge Instructions (Signed)

## 2015-04-24 NOTE — ED Notes (Addendum)
Pt reports right flank pain and urinary urgency x1 hour ago. nad noted.

## 2015-05-01 DIAGNOSIS — E291 Testicular hypofunction: Secondary | ICD-10-CM | POA: Diagnosis not present

## 2015-05-15 DIAGNOSIS — E291 Testicular hypofunction: Secondary | ICD-10-CM | POA: Diagnosis not present

## 2015-05-29 DIAGNOSIS — E291 Testicular hypofunction: Secondary | ICD-10-CM | POA: Diagnosis not present

## 2015-06-07 DIAGNOSIS — F331 Major depressive disorder, recurrent, moderate: Secondary | ICD-10-CM | POA: Diagnosis not present

## 2015-06-07 DIAGNOSIS — R5383 Other fatigue: Secondary | ICD-10-CM | POA: Diagnosis not present

## 2015-06-07 DIAGNOSIS — E291 Testicular hypofunction: Secondary | ICD-10-CM | POA: Diagnosis not present

## 2015-06-07 DIAGNOSIS — I10 Essential (primary) hypertension: Secondary | ICD-10-CM | POA: Diagnosis not present

## 2015-06-08 ENCOUNTER — Emergency Department (HOSPITAL_COMMUNITY)
Admission: EM | Admit: 2015-06-08 | Discharge: 2015-06-08 | Disposition: A | Discharge status: Home / Self Care | Payer: Medicare Other | Attending: Emergency Medicine | Admitting: Emergency Medicine

## 2015-06-08 ENCOUNTER — Encounter (HOSPITAL_COMMUNITY): Payer: Self-pay | Admitting: *Deleted

## 2015-06-08 DIAGNOSIS — Y9289 Other specified places as the place of occurrence of the external cause: Secondary | ICD-10-CM | POA: Insufficient documentation

## 2015-06-08 DIAGNOSIS — Z79899 Other long term (current) drug therapy: Secondary | ICD-10-CM | POA: Diagnosis not present

## 2015-06-08 DIAGNOSIS — S0003XA Contusion of scalp, initial encounter: Secondary | ICD-10-CM

## 2015-06-08 DIAGNOSIS — S0990XA Unspecified injury of head, initial encounter: Secondary | ICD-10-CM | POA: Diagnosis present

## 2015-06-08 DIAGNOSIS — Z87442 Personal history of urinary calculi: Secondary | ICD-10-CM | POA: Diagnosis not present

## 2015-06-08 DIAGNOSIS — Y9389 Activity, other specified: Secondary | ICD-10-CM | POA: Diagnosis not present

## 2015-06-08 DIAGNOSIS — S0101XA Laceration without foreign body of scalp, initial encounter: Secondary | ICD-10-CM | POA: Diagnosis not present

## 2015-06-08 DIAGNOSIS — W01198A Fall on same level from slipping, tripping and stumbling with subsequent striking against other object, initial encounter: Secondary | ICD-10-CM | POA: Insufficient documentation

## 2015-06-08 DIAGNOSIS — W19XXXA Unspecified fall, initial encounter: Secondary | ICD-10-CM

## 2015-06-08 DIAGNOSIS — Y998 Other external cause status: Secondary | ICD-10-CM | POA: Diagnosis not present

## 2015-06-08 NOTE — ED Provider Notes (Signed)
CSN: [id number]     Arrival date & time 06/08/15  1844 History   First MD Initiated Contact with Patient 06/08/15 1921     Chief Complaint  Patient presents with  . Fall     (Consider location/radiation/quality/duration/timing/severity/associated sxs/prior Treatment) HPI Edward Jacobson is a 59 y.o. male who presents to the ED with a laceration to his head after he fell. He states he was in his "junk" room and fell over junk and hit his head. He denies LOC. He did have mild nausea after the fall. He states that he thought he was fine but his girlfriend told him to come in.   Past Medical History  Diagnosis Date  . Renal disorder     kidneys stones   Past Surgical History  Procedure Laterality Date  . Shoulder surgery Left    History reviewed. No pertinent family history. History  Substance Use Topics  . Smoking status: Never Smoker   . Smokeless tobacco: Not on file  . Alcohol Use: No    Review of Systems Negative except as stated in HPI   Allergies  Review of patient's allergies indicates no known allergies.  Home Medications   Prior to Admission medications   Medication Sig Start Date End Date Taking? Authorizing Provider  buPROPion (WELLBUTRIN XL) 300 MG 24 hr tablet Take 300 mg by mouth daily.    Historical Provider, MD  Fish Oil-Cholecalciferol (FISH OIL + D3 PO) Take 1 capsule by mouth daily.    Historical Provider, MD  FLUoxetine (PROZAC) 20 MG capsule Take 60 mg by mouth daily.    Historical Provider, MD  GARLIC PO Take 1 tablet by mouth daily.    Historical Provider, MD  GINSENG PO Take 1 tablet by mouth daily.    Historical Provider, MD  L-ARGININE PO Take 1 tablet by mouth daily.    Historical Provider, MD  liothyronine (CYTOMEL) 25 MCG tablet Take 25 mcg by mouth daily.    Historical Provider, MD  Multiple Vitamin (MULTIVITAMIN WITH MINERALS) TABS tablet Take 1 tablet by mouth daily.    Historical Provider, MD  ondansetron (ZOFRAN ODT) 4 MG disintegrating  tablet Take 1 tablet (4 mg total) by mouth every 8 (eight) hours as needed for nausea. 04/24/15   Tanna Furry, MD  oxyCODONE-acetaminophen (PERCOCET/ROXICET) 5-325 MG per tablet Take 2 tablets by mouth every 4 (four) hours as needed. 04/24/15   Tanna Furry, MD  tamsulosin (FLOMAX) 0.4 MG CAPS capsule Take 1 capsule (0.4 mg total) by mouth daily. 04/24/15   Tanna Furry, MD   BP 131/88 mmHg  Pulse 99  Temp(Src) 99.6 F (37.6 C) (Oral)  Resp 20  Ht 5\' 10"  (1.778 m)  Wt 204 lb (92.534 kg)  BMI 29.27 kg/m2  SpO2 99% Physical Exam  Constitutional: He is oriented to person, place, and time. He appears well-developed and well-nourished. No distress.  HENT:  Head: Head is with laceration.  Right Ear: Tympanic membrane normal.  Left Ear: Tympanic membrane normal.  Nose: Nose normal.  Mouth/Throat: Uvula is midline, oropharynx is clear and moist and mucous membranes are normal.  Superficial laceration to the right scalp area.   Eyes: Conjunctivae and EOM are normal. Pupils are equal, round, and reactive to light.  Neck: Normal range of motion. Neck supple.  Cardiovascular: Normal rate and regular rhythm.   Pulmonary/Chest: Effort normal. He has no wheezes. He has no rales.  Abdominal: Soft. There is no tenderness.  Musculoskeletal: Normal range of motion. He  exhibits no edema.  Radial and pedal pulses strong, adequate circulation, good touch sensation.  Neurological: He is alert and oriented to person, place, and time. He has normal strength. No cranial nerve deficit or sensory deficit. He displays a negative Romberg sign. Gait normal.  Reflex Scores:      Bicep reflexes are 2+ on the right side and 2+ on the left side.      Brachioradialis reflexes are 2+ on the right side and 2+ on the left side.      Patellar reflexes are 2+ on the right side and 2+ on the left side.      Achilles reflexes are 2+ on the right side and 2+ on the left side. Rapid alternating movement without difficulty. Stands on  one foot without difficulty.  Psychiatric: He has a normal mood and affect. His behavior is normal.    ED Course  Procedures (including critical care time) Wound care, laceration does not require sutures.   Labs Review Labs Reviewed - No data to display  Imaging Review No results found.   EKG Interpretation None      MDM  59 y.o. male with laceration of the scalp s/p fall. Stable for d/c without hx of LOC and no focal neuro deficits. Wound cleaned, instructions for patient's friend on head injury and signs and symptoms that would flag patient to return. Discussed with the patient and all questioned fully answered. He will return if any problems arise.   Final diagnoses:  Laceration of scalp, initial encounter  Contusion of scalp, initial encounter  Fall, initial encounter        [facility], NP 06/09/15 [place], MD 06/11/15 [phone]

## 2015-06-08 NOTE — Discharge Instructions (Signed)
Return for worsening symptoms.    

## 2015-06-08 NOTE — ED Notes (Addendum)
Pt states he was in his "junk room" and fell over something and hit his head. Pt states he didn't hit hard but he does have a small cut in his head. Pt denies loc. Pt states afterwards, he felt a little sleepy and nauseated.

## 2015-06-12 DIAGNOSIS — E291 Testicular hypofunction: Secondary | ICD-10-CM | POA: Diagnosis not present

## 2015-06-14 DIAGNOSIS — Z0001 Encounter for general adult medical examination with abnormal findings: Secondary | ICD-10-CM | POA: Diagnosis not present

## 2015-06-14 DIAGNOSIS — E782 Mixed hyperlipidemia: Secondary | ICD-10-CM | POA: Diagnosis not present

## 2015-06-14 DIAGNOSIS — N183 Chronic kidney disease, stage 3 (moderate): Secondary | ICD-10-CM | POA: Diagnosis not present

## 2015-06-14 DIAGNOSIS — F331 Major depressive disorder, recurrent, moderate: Secondary | ICD-10-CM | POA: Diagnosis not present

## 2015-06-14 DIAGNOSIS — I10 Essential (primary) hypertension: Secondary | ICD-10-CM | POA: Diagnosis not present

## 2015-06-14 DIAGNOSIS — N529 Male erectile dysfunction, unspecified: Secondary | ICD-10-CM | POA: Diagnosis not present

## 2015-06-27 DIAGNOSIS — R5383 Other fatigue: Secondary | ICD-10-CM | POA: Diagnosis not present

## 2015-06-27 DIAGNOSIS — E291 Testicular hypofunction: Secondary | ICD-10-CM | POA: Diagnosis not present

## 2015-07-12 DIAGNOSIS — N529 Male erectile dysfunction, unspecified: Secondary | ICD-10-CM | POA: Diagnosis not present

## 2015-07-12 DIAGNOSIS — E291 Testicular hypofunction: Secondary | ICD-10-CM | POA: Diagnosis not present

## 2015-07-26 DIAGNOSIS — E291 Testicular hypofunction: Secondary | ICD-10-CM | POA: Diagnosis not present

## 2015-08-08 ENCOUNTER — Other Ambulatory Visit: Payer: Self-pay | Admitting: *Deleted

## 2015-08-08 NOTE — Patient Outreach (Signed)
[facility]) Care Management  08/08/2015  WARRICK LLERA 12/01/1956 [id number]   Referral from MD with notes, assigned Jacqlyn Larsen, RN to outreach.  Thanks, Ronnell Freshwater. [place], [place] Assistant Phone: [phone] Fax: [phone]

## 2015-08-08 NOTE — Patient Outreach (Signed)
08/08/15- Referral received from data analysis (MD office), telephone call to patient at [phone], no answer to telephone and received unindentified voicemail, called [phone] and no answer to telephone, received unindentified voicemail.  PLAN Will attempt to call pt next week  Jacqlyn Larsen [facility], [place] Coordinator [phone]

## 2015-08-09 DIAGNOSIS — E291 Testicular hypofunction: Secondary | ICD-10-CM | POA: Diagnosis not present

## 2015-08-15 DIAGNOSIS — Z1211 Encounter for screening for malignant neoplasm of colon: Secondary | ICD-10-CM | POA: Diagnosis not present

## 2015-08-15 DIAGNOSIS — F329 Major depressive disorder, single episode, unspecified: Secondary | ICD-10-CM | POA: Diagnosis not present

## 2015-08-15 DIAGNOSIS — Z79899 Other long term (current) drug therapy: Secondary | ICD-10-CM | POA: Diagnosis not present

## 2015-08-15 DIAGNOSIS — M1990 Unspecified osteoarthritis, unspecified site: Secondary | ICD-10-CM | POA: Diagnosis not present

## 2015-08-15 DIAGNOSIS — Z8489 Family history of other specified conditions: Secondary | ICD-10-CM | POA: Diagnosis not present

## 2015-08-15 DIAGNOSIS — Z87442 Personal history of urinary calculi: Secondary | ICD-10-CM | POA: Diagnosis not present

## 2015-08-15 DIAGNOSIS — K635 Polyp of colon: Secondary | ICD-10-CM | POA: Diagnosis not present

## 2015-08-15 DIAGNOSIS — D369 Benign neoplasm, unspecified site: Secondary | ICD-10-CM | POA: Diagnosis not present

## 2015-08-15 DIAGNOSIS — D126 Benign neoplasm of colon, unspecified: Secondary | ICD-10-CM | POA: Diagnosis not present

## 2015-08-16 ENCOUNTER — Encounter: Payer: Self-pay | Admitting: *Deleted

## 2015-08-16 ENCOUNTER — Other Ambulatory Visit: Payer: Self-pay | Admitting: *Deleted

## 2015-08-16 NOTE — Patient Outreach (Signed)
08/16/15- Referral received from data analysis (MD office), spoke with pt, HIPAA verified, explained [facility] program, pt reports he has all medications and can afford, verbalizes understanding of medications, pt drives and attends all appoinments, states "blood pressure under control" , sees MD regularly, pt states he walks and lifts light weights, follows low sodium diet.  Pt reports he has no needs and refuses [facility] program and requests information be sent to his home with [facility] phone number for future reference.  RN CM faxed Dr. Quillian Quince and reported case closure, mailed [facility] information to patient's home.  Jacqlyn Larsen [facility], [place] Coordinator [phone]

## 2015-08-19 DIAGNOSIS — K635 Polyp of colon: Secondary | ICD-10-CM | POA: Diagnosis not present

## 2015-08-19 DIAGNOSIS — R002 Palpitations: Secondary | ICD-10-CM | POA: Diagnosis not present

## 2015-08-19 DIAGNOSIS — N183 Chronic kidney disease, stage 3 (moderate): Secondary | ICD-10-CM | POA: Diagnosis not present

## 2015-08-23 DIAGNOSIS — E291 Testicular hypofunction: Secondary | ICD-10-CM | POA: Diagnosis not present

## 2015-08-27 NOTE — Patient Outreach (Signed)
[facility]) Care Management  08/27/2015  Edward Jacobson May 11, 1956 [id number]   Notification from Edward Larsen, RN to close case due to patient refused [facility] [facility] services.  Thanks, Ronnell Freshwater. [place], [place] Assistant Phone: [phone] Fax: [phone]

## 2015-09-03 DIAGNOSIS — E291 Testicular hypofunction: Secondary | ICD-10-CM | POA: Diagnosis not present

## 2015-09-18 DIAGNOSIS — E291 Testicular hypofunction: Secondary | ICD-10-CM | POA: Diagnosis not present

## 2015-10-16 DIAGNOSIS — E291 Testicular hypofunction: Secondary | ICD-10-CM | POA: Diagnosis not present

## 2015-10-16 DIAGNOSIS — L03114 Cellulitis of left upper limb: Secondary | ICD-10-CM | POA: Diagnosis not present

## 2015-10-30 DIAGNOSIS — E291 Testicular hypofunction: Secondary | ICD-10-CM | POA: Diagnosis not present

## 2015-10-30 DIAGNOSIS — D485 Neoplasm of uncertain behavior of skin: Secondary | ICD-10-CM | POA: Diagnosis not present

## 2015-11-07 DIAGNOSIS — L219 Seborrheic dermatitis, unspecified: Secondary | ICD-10-CM | POA: Diagnosis not present

## 2015-11-07 DIAGNOSIS — C44629 Squamous cell carcinoma of skin of left upper limb, including shoulder: Secondary | ICD-10-CM | POA: Diagnosis not present

## 2015-11-13 DIAGNOSIS — E291 Testicular hypofunction: Secondary | ICD-10-CM | POA: Diagnosis not present

## 2015-12-02 DIAGNOSIS — H81311 Aural vertigo, right ear: Secondary | ICD-10-CM | POA: Diagnosis not present

## 2015-12-02 DIAGNOSIS — J0101 Acute recurrent maxillary sinusitis: Secondary | ICD-10-CM | POA: Diagnosis not present

## 2015-12-03 DIAGNOSIS — E291 Testicular hypofunction: Secondary | ICD-10-CM | POA: Diagnosis not present

## 2015-12-18 DIAGNOSIS — E291 Testicular hypofunction: Secondary | ICD-10-CM | POA: Diagnosis not present

## 2015-12-23 DIAGNOSIS — E782 Mixed hyperlipidemia: Secondary | ICD-10-CM | POA: Diagnosis not present

## 2015-12-23 DIAGNOSIS — I10 Essential (primary) hypertension: Secondary | ICD-10-CM | POA: Diagnosis not present

## 2015-12-23 DIAGNOSIS — Z23 Encounter for immunization: Secondary | ICD-10-CM | POA: Diagnosis not present

## 2015-12-23 DIAGNOSIS — F331 Major depressive disorder, recurrent, moderate: Secondary | ICD-10-CM | POA: Diagnosis not present

## 2015-12-23 DIAGNOSIS — N183 Chronic kidney disease, stage 3 (moderate): Secondary | ICD-10-CM | POA: Diagnosis not present

## 2015-12-23 DIAGNOSIS — N529 Male erectile dysfunction, unspecified: Secondary | ICD-10-CM | POA: Diagnosis not present

## 2016-01-01 DIAGNOSIS — E291 Testicular hypofunction: Secondary | ICD-10-CM | POA: Diagnosis not present

## 2016-01-15 DIAGNOSIS — E291 Testicular hypofunction: Secondary | ICD-10-CM | POA: Diagnosis not present

## 2016-01-29 DIAGNOSIS — E291 Testicular hypofunction: Secondary | ICD-10-CM | POA: Diagnosis not present

## 2016-02-12 DIAGNOSIS — E291 Testicular hypofunction: Secondary | ICD-10-CM | POA: Diagnosis not present

## 2016-02-26 DIAGNOSIS — E291 Testicular hypofunction: Secondary | ICD-10-CM | POA: Diagnosis not present

## 2016-03-11 DIAGNOSIS — E291 Testicular hypofunction: Secondary | ICD-10-CM | POA: Diagnosis not present

## 2016-03-18 DIAGNOSIS — S52609A Unspecified fracture of lower end of unspecified ulna, initial encounter for closed fracture: Secondary | ICD-10-CM | POA: Diagnosis not present

## 2016-03-18 DIAGNOSIS — S5292XA Unspecified fracture of left forearm, initial encounter for closed fracture: Secondary | ICD-10-CM | POA: Diagnosis not present

## 2016-03-19 DIAGNOSIS — S60219A Contusion of unspecified wrist, initial encounter: Secondary | ICD-10-CM | POA: Diagnosis not present

## 2016-03-25 DIAGNOSIS — E291 Testicular hypofunction: Secondary | ICD-10-CM | POA: Diagnosis not present

## 2016-04-09 DIAGNOSIS — E291 Testicular hypofunction: Secondary | ICD-10-CM | POA: Diagnosis not present

## 2016-04-13 DIAGNOSIS — R5383 Other fatigue: Secondary | ICD-10-CM | POA: Diagnosis not present

## 2016-04-13 DIAGNOSIS — I10 Essential (primary) hypertension: Secondary | ICD-10-CM | POA: Diagnosis not present

## 2016-04-13 DIAGNOSIS — N183 Chronic kidney disease, stage 3 (moderate): Secondary | ICD-10-CM | POA: Diagnosis not present

## 2016-04-13 DIAGNOSIS — E291 Testicular hypofunction: Secondary | ICD-10-CM | POA: Diagnosis not present

## 2016-04-13 DIAGNOSIS — E782 Mixed hyperlipidemia: Secondary | ICD-10-CM | POA: Diagnosis not present

## 2016-04-16 DIAGNOSIS — N529 Male erectile dysfunction, unspecified: Secondary | ICD-10-CM | POA: Diagnosis not present

## 2016-04-16 DIAGNOSIS — N183 Chronic kidney disease, stage 3 (moderate): Secondary | ICD-10-CM | POA: Diagnosis not present

## 2016-04-16 DIAGNOSIS — I10 Essential (primary) hypertension: Secondary | ICD-10-CM | POA: Diagnosis not present

## 2016-04-16 DIAGNOSIS — M7050 Other bursitis of knee, unspecified knee: Secondary | ICD-10-CM | POA: Diagnosis not present

## 2016-04-16 DIAGNOSIS — Z9189 Other specified personal risk factors, not elsewhere classified: Secondary | ICD-10-CM | POA: Diagnosis not present

## 2016-04-16 DIAGNOSIS — Z1389 Encounter for screening for other disorder: Secondary | ICD-10-CM | POA: Diagnosis not present

## 2016-04-16 DIAGNOSIS — E782 Mixed hyperlipidemia: Secondary | ICD-10-CM | POA: Diagnosis not present

## 2016-04-16 DIAGNOSIS — F331 Major depressive disorder, recurrent, moderate: Secondary | ICD-10-CM | POA: Diagnosis not present

## 2016-04-23 DIAGNOSIS — E291 Testicular hypofunction: Secondary | ICD-10-CM | POA: Diagnosis not present

## 2016-05-07 DIAGNOSIS — E291 Testicular hypofunction: Secondary | ICD-10-CM | POA: Diagnosis not present

## 2016-05-21 DIAGNOSIS — E291 Testicular hypofunction: Secondary | ICD-10-CM | POA: Diagnosis not present

## 2016-06-04 DIAGNOSIS — E291 Testicular hypofunction: Secondary | ICD-10-CM | POA: Diagnosis not present

## 2016-06-18 DIAGNOSIS — E291 Testicular hypofunction: Secondary | ICD-10-CM | POA: Diagnosis not present

## 2016-07-02 DIAGNOSIS — E291 Testicular hypofunction: Secondary | ICD-10-CM | POA: Diagnosis not present

## 2016-07-31 DIAGNOSIS — E291 Testicular hypofunction: Secondary | ICD-10-CM | POA: Diagnosis not present

## 2016-07-31 DIAGNOSIS — N529 Male erectile dysfunction, unspecified: Secondary | ICD-10-CM | POA: Diagnosis not present

## 2016-09-01 DIAGNOSIS — Z9189 Other specified personal risk factors, not elsewhere classified: Secondary | ICD-10-CM | POA: Diagnosis not present

## 2016-09-01 DIAGNOSIS — M7050 Other bursitis of knee, unspecified knee: Secondary | ICD-10-CM | POA: Diagnosis not present

## 2016-09-01 DIAGNOSIS — E782 Mixed hyperlipidemia: Secondary | ICD-10-CM | POA: Diagnosis not present

## 2016-09-01 DIAGNOSIS — Z6832 Body mass index (BMI) 32.0-32.9, adult: Secondary | ICD-10-CM | POA: Diagnosis not present

## 2016-09-01 DIAGNOSIS — N529 Male erectile dysfunction, unspecified: Secondary | ICD-10-CM | POA: Diagnosis not present

## 2016-09-01 DIAGNOSIS — Z1212 Encounter for screening for malignant neoplasm of rectum: Secondary | ICD-10-CM | POA: Diagnosis not present

## 2016-09-01 DIAGNOSIS — N183 Chronic kidney disease, stage 3 (moderate): Secondary | ICD-10-CM | POA: Diagnosis not present

## 2016-09-01 DIAGNOSIS — F331 Major depressive disorder, recurrent, moderate: Secondary | ICD-10-CM | POA: Diagnosis not present

## 2016-09-01 DIAGNOSIS — F5221 Male erectile disorder: Secondary | ICD-10-CM | POA: Diagnosis not present

## 2016-09-01 DIAGNOSIS — I10 Essential (primary) hypertension: Secondary | ICD-10-CM | POA: Diagnosis not present

## 2016-09-01 DIAGNOSIS — Z0001 Encounter for general adult medical examination with abnormal findings: Secondary | ICD-10-CM | POA: Diagnosis not present

## 2016-09-07 DIAGNOSIS — E291 Testicular hypofunction: Secondary | ICD-10-CM | POA: Diagnosis not present

## 2016-09-18 DIAGNOSIS — Z23 Encounter for immunization: Secondary | ICD-10-CM | POA: Diagnosis not present

## 2016-09-18 DIAGNOSIS — L570 Actinic keratosis: Secondary | ICD-10-CM | POA: Diagnosis not present

## 2016-09-22 DIAGNOSIS — E291 Testicular hypofunction: Secondary | ICD-10-CM | POA: Diagnosis not present

## 2016-10-02 DIAGNOSIS — E291 Testicular hypofunction: Secondary | ICD-10-CM | POA: Diagnosis not present

## 2016-10-07 DIAGNOSIS — E291 Testicular hypofunction: Secondary | ICD-10-CM | POA: Diagnosis not present

## 2016-10-23 DIAGNOSIS — E291 Testicular hypofunction: Secondary | ICD-10-CM | POA: Diagnosis not present

## 2016-11-05 DIAGNOSIS — E291 Testicular hypofunction: Secondary | ICD-10-CM | POA: Diagnosis not present

## 2016-11-06 DIAGNOSIS — M25511 Pain in right shoulder: Secondary | ICD-10-CM | POA: Diagnosis not present

## 2016-11-06 DIAGNOSIS — Z6833 Body mass index (BMI) 33.0-33.9, adult: Secondary | ICD-10-CM | POA: Diagnosis not present

## 2016-11-27 DIAGNOSIS — E291 Testicular hypofunction: Secondary | ICD-10-CM | POA: Diagnosis not present

## 2016-12-11 DIAGNOSIS — E291 Testicular hypofunction: Secondary | ICD-10-CM | POA: Diagnosis not present

## 2016-12-11 DIAGNOSIS — Z23 Encounter for immunization: Secondary | ICD-10-CM | POA: Diagnosis not present

## 2016-12-25 DIAGNOSIS — J0101 Acute recurrent maxillary sinusitis: Secondary | ICD-10-CM | POA: Diagnosis not present

## 2016-12-25 DIAGNOSIS — Z6831 Body mass index (BMI) 31.0-31.9, adult: Secondary | ICD-10-CM | POA: Diagnosis not present

## 2016-12-25 DIAGNOSIS — E291 Testicular hypofunction: Secondary | ICD-10-CM | POA: Diagnosis not present

## 2016-12-29 DIAGNOSIS — N529 Male erectile dysfunction, unspecified: Secondary | ICD-10-CM | POA: Diagnosis not present

## 2016-12-29 DIAGNOSIS — I10 Essential (primary) hypertension: Secondary | ICD-10-CM | POA: Diagnosis not present

## 2016-12-29 DIAGNOSIS — E782 Mixed hyperlipidemia: Secondary | ICD-10-CM | POA: Diagnosis not present

## 2016-12-29 DIAGNOSIS — N183 Chronic kidney disease, stage 3 (moderate): Secondary | ICD-10-CM | POA: Diagnosis not present

## 2016-12-29 DIAGNOSIS — F331 Major depressive disorder, recurrent, moderate: Secondary | ICD-10-CM | POA: Diagnosis not present

## 2016-12-29 DIAGNOSIS — M7050 Other bursitis of knee, unspecified knee: Secondary | ICD-10-CM | POA: Diagnosis not present

## 2016-12-29 DIAGNOSIS — F5221 Male erectile disorder: Secondary | ICD-10-CM | POA: Diagnosis not present

## 2016-12-29 DIAGNOSIS — Z6830 Body mass index (BMI) 30.0-30.9, adult: Secondary | ICD-10-CM | POA: Diagnosis not present

## 2017-01-27 DIAGNOSIS — F900 Attention-deficit hyperactivity disorder, predominantly inattentive type: Secondary | ICD-10-CM | POA: Diagnosis not present

## 2017-01-27 DIAGNOSIS — Z6830 Body mass index (BMI) 30.0-30.9, adult: Secondary | ICD-10-CM | POA: Diagnosis not present

## 2017-01-27 DIAGNOSIS — F331 Major depressive disorder, recurrent, moderate: Secondary | ICD-10-CM | POA: Diagnosis not present

## 2017-02-02 DIAGNOSIS — E291 Testicular hypofunction: Secondary | ICD-10-CM | POA: Diagnosis not present

## 2017-02-17 DIAGNOSIS — E291 Testicular hypofunction: Secondary | ICD-10-CM | POA: Diagnosis not present

## 2017-02-17 DIAGNOSIS — F5221 Male erectile disorder: Secondary | ICD-10-CM | POA: Diagnosis not present

## 2017-02-22 DIAGNOSIS — L570 Actinic keratosis: Secondary | ICD-10-CM | POA: Diagnosis not present

## 2017-03-18 DIAGNOSIS — E291 Testicular hypofunction: Secondary | ICD-10-CM | POA: Diagnosis not present

## 2017-05-04 DIAGNOSIS — E291 Testicular hypofunction: Secondary | ICD-10-CM | POA: Diagnosis not present

## 2017-05-04 DIAGNOSIS — N183 Chronic kidney disease, stage 3 (moderate): Secondary | ICD-10-CM | POA: Diagnosis not present

## 2017-05-04 DIAGNOSIS — I10 Essential (primary) hypertension: Secondary | ICD-10-CM | POA: Diagnosis not present

## 2017-05-04 DIAGNOSIS — E782 Mixed hyperlipidemia: Secondary | ICD-10-CM | POA: Diagnosis not present

## 2017-05-04 DIAGNOSIS — R5383 Other fatigue: Secondary | ICD-10-CM | POA: Diagnosis not present

## 2017-05-07 DIAGNOSIS — I10 Essential (primary) hypertension: Secondary | ICD-10-CM | POA: Diagnosis not present

## 2017-05-07 DIAGNOSIS — Z6830 Body mass index (BMI) 30.0-30.9, adult: Secondary | ICD-10-CM | POA: Diagnosis not present

## 2017-05-07 DIAGNOSIS — Z1389 Encounter for screening for other disorder: Secondary | ICD-10-CM | POA: Diagnosis not present

## 2017-05-07 DIAGNOSIS — N183 Chronic kidney disease, stage 3 (moderate): Secondary | ICD-10-CM | POA: Diagnosis not present

## 2017-05-07 DIAGNOSIS — E782 Mixed hyperlipidemia: Secondary | ICD-10-CM | POA: Diagnosis not present

## 2017-05-07 DIAGNOSIS — F5221 Male erectile disorder: Secondary | ICD-10-CM | POA: Diagnosis not present

## 2017-05-07 DIAGNOSIS — M7050 Other bursitis of knee, unspecified knee: Secondary | ICD-10-CM | POA: Diagnosis not present

## 2017-05-07 DIAGNOSIS — F900 Attention-deficit hyperactivity disorder, predominantly inattentive type: Secondary | ICD-10-CM | POA: Diagnosis not present

## 2017-05-21 DIAGNOSIS — E291 Testicular hypofunction: Secondary | ICD-10-CM | POA: Diagnosis not present

## 2017-06-15 DIAGNOSIS — E291 Testicular hypofunction: Secondary | ICD-10-CM | POA: Diagnosis not present

## 2017-06-15 DIAGNOSIS — N529 Male erectile dysfunction, unspecified: Secondary | ICD-10-CM | POA: Diagnosis not present

## 2017-06-29 DIAGNOSIS — N529 Male erectile dysfunction, unspecified: Secondary | ICD-10-CM | POA: Diagnosis not present

## 2017-07-27 DIAGNOSIS — F5221 Male erectile disorder: Secondary | ICD-10-CM | POA: Diagnosis not present

## 2017-07-27 DIAGNOSIS — E291 Testicular hypofunction: Secondary | ICD-10-CM | POA: Diagnosis not present

## 2017-08-10 DIAGNOSIS — E291 Testicular hypofunction: Secondary | ICD-10-CM | POA: Diagnosis not present

## 2017-08-10 DIAGNOSIS — N529 Male erectile dysfunction, unspecified: Secondary | ICD-10-CM | POA: Diagnosis not present

## 2017-08-31 DIAGNOSIS — F5221 Male erectile disorder: Secondary | ICD-10-CM | POA: Diagnosis not present

## 2017-08-31 DIAGNOSIS — N400 Enlarged prostate without lower urinary tract symptoms: Secondary | ICD-10-CM | POA: Diagnosis not present

## 2017-08-31 DIAGNOSIS — N183 Chronic kidney disease, stage 3 (moderate): Secondary | ICD-10-CM | POA: Diagnosis not present

## 2017-08-31 DIAGNOSIS — E782 Mixed hyperlipidemia: Secondary | ICD-10-CM | POA: Diagnosis not present

## 2017-08-31 DIAGNOSIS — I10 Essential (primary) hypertension: Secondary | ICD-10-CM | POA: Diagnosis not present

## 2017-08-31 DIAGNOSIS — Z9189 Other specified personal risk factors, not elsewhere classified: Secondary | ICD-10-CM | POA: Diagnosis not present

## 2017-08-31 DIAGNOSIS — E291 Testicular hypofunction: Secondary | ICD-10-CM | POA: Diagnosis not present

## 2017-09-03 DIAGNOSIS — F900 Attention-deficit hyperactivity disorder, predominantly inattentive type: Secondary | ICD-10-CM | POA: Diagnosis not present

## 2017-09-03 DIAGNOSIS — Z6827 Body mass index (BMI) 27.0-27.9, adult: Secondary | ICD-10-CM | POA: Diagnosis not present

## 2017-09-03 DIAGNOSIS — E782 Mixed hyperlipidemia: Secondary | ICD-10-CM | POA: Diagnosis not present

## 2017-09-03 DIAGNOSIS — Z1212 Encounter for screening for malignant neoplasm of rectum: Secondary | ICD-10-CM | POA: Diagnosis not present

## 2017-09-03 DIAGNOSIS — Z0001 Encounter for general adult medical examination with abnormal findings: Secondary | ICD-10-CM | POA: Diagnosis not present

## 2017-09-03 DIAGNOSIS — I10 Essential (primary) hypertension: Secondary | ICD-10-CM | POA: Diagnosis not present

## 2017-09-03 DIAGNOSIS — F5221 Male erectile disorder: Secondary | ICD-10-CM | POA: Diagnosis not present

## 2017-09-03 DIAGNOSIS — N183 Chronic kidney disease, stage 3 (moderate): Secondary | ICD-10-CM | POA: Diagnosis not present

## 2017-09-24 DIAGNOSIS — E291 Testicular hypofunction: Secondary | ICD-10-CM | POA: Diagnosis not present

## 2017-10-11 DIAGNOSIS — E291 Testicular hypofunction: Secondary | ICD-10-CM | POA: Diagnosis not present

## 2017-10-26 DIAGNOSIS — E291 Testicular hypofunction: Secondary | ICD-10-CM | POA: Diagnosis not present

## 2017-11-17 DIAGNOSIS — E291 Testicular hypofunction: Secondary | ICD-10-CM | POA: Diagnosis not present

## 2017-12-01 DIAGNOSIS — E291 Testicular hypofunction: Secondary | ICD-10-CM | POA: Diagnosis not present

## 2017-12-09 DIAGNOSIS — E291 Testicular hypofunction: Secondary | ICD-10-CM | POA: Diagnosis not present

## 2017-12-09 DIAGNOSIS — E782 Mixed hyperlipidemia: Secondary | ICD-10-CM | POA: Diagnosis not present

## 2017-12-09 DIAGNOSIS — F5221 Male erectile disorder: Secondary | ICD-10-CM | POA: Diagnosis not present

## 2017-12-09 DIAGNOSIS — Z9189 Other specified personal risk factors, not elsewhere classified: Secondary | ICD-10-CM | POA: Diagnosis not present

## 2017-12-09 DIAGNOSIS — N183 Chronic kidney disease, stage 3 (moderate): Secondary | ICD-10-CM | POA: Diagnosis not present

## 2017-12-09 DIAGNOSIS — F900 Attention-deficit hyperactivity disorder, predominantly inattentive type: Secondary | ICD-10-CM | POA: Diagnosis not present

## 2017-12-09 DIAGNOSIS — F329 Major depressive disorder, single episode, unspecified: Secondary | ICD-10-CM | POA: Diagnosis not present

## 2017-12-09 DIAGNOSIS — I10 Essential (primary) hypertension: Secondary | ICD-10-CM | POA: Diagnosis not present

## 2017-12-13 DIAGNOSIS — E782 Mixed hyperlipidemia: Secondary | ICD-10-CM | POA: Diagnosis not present

## 2017-12-13 DIAGNOSIS — F900 Attention-deficit hyperactivity disorder, predominantly inattentive type: Secondary | ICD-10-CM | POA: Diagnosis not present

## 2017-12-13 DIAGNOSIS — Z6827 Body mass index (BMI) 27.0-27.9, adult: Secondary | ICD-10-CM | POA: Diagnosis not present

## 2017-12-13 DIAGNOSIS — Z23 Encounter for immunization: Secondary | ICD-10-CM | POA: Diagnosis not present

## 2017-12-13 DIAGNOSIS — I10 Essential (primary) hypertension: Secondary | ICD-10-CM | POA: Diagnosis not present

## 2017-12-13 DIAGNOSIS — F5221 Male erectile disorder: Secondary | ICD-10-CM | POA: Diagnosis not present

## 2017-12-13 DIAGNOSIS — N183 Chronic kidney disease, stage 3 (moderate): Secondary | ICD-10-CM | POA: Diagnosis not present

## 2017-12-13 DIAGNOSIS — F331 Major depressive disorder, recurrent, moderate: Secondary | ICD-10-CM | POA: Diagnosis not present

## 2018-01-20 DIAGNOSIS — N529 Male erectile dysfunction, unspecified: Secondary | ICD-10-CM | POA: Diagnosis not present

## 2018-01-20 DIAGNOSIS — E291 Testicular hypofunction: Secondary | ICD-10-CM | POA: Diagnosis not present

## 2018-02-11 DIAGNOSIS — C4442 Squamous cell carcinoma of skin of scalp and neck: Secondary | ICD-10-CM | POA: Diagnosis not present

## 2018-02-11 DIAGNOSIS — D044 Carcinoma in situ of skin of scalp and neck: Secondary | ICD-10-CM | POA: Diagnosis not present

## 2018-02-14 DIAGNOSIS — E291 Testicular hypofunction: Secondary | ICD-10-CM | POA: Diagnosis not present

## 2018-02-28 DIAGNOSIS — E291 Testicular hypofunction: Secondary | ICD-10-CM | POA: Diagnosis not present

## 2018-03-18 DIAGNOSIS — E291 Testicular hypofunction: Secondary | ICD-10-CM | POA: Diagnosis not present

## 2018-03-22 DIAGNOSIS — E291 Testicular hypofunction: Secondary | ICD-10-CM | POA: Diagnosis not present

## 2018-03-22 DIAGNOSIS — R002 Palpitations: Secondary | ICD-10-CM | POA: Diagnosis not present

## 2018-03-22 DIAGNOSIS — E782 Mixed hyperlipidemia: Secondary | ICD-10-CM | POA: Diagnosis not present

## 2018-03-22 DIAGNOSIS — R5383 Other fatigue: Secondary | ICD-10-CM | POA: Diagnosis not present

## 2018-03-22 DIAGNOSIS — F5221 Male erectile disorder: Secondary | ICD-10-CM | POA: Diagnosis not present

## 2018-03-22 DIAGNOSIS — F329 Major depressive disorder, single episode, unspecified: Secondary | ICD-10-CM | POA: Diagnosis not present

## 2018-03-22 DIAGNOSIS — F900 Attention-deficit hyperactivity disorder, predominantly inattentive type: Secondary | ICD-10-CM | POA: Diagnosis not present

## 2018-03-22 DIAGNOSIS — I10 Essential (primary) hypertension: Secondary | ICD-10-CM | POA: Diagnosis not present

## 2018-03-22 DIAGNOSIS — N183 Chronic kidney disease, stage 3 (moderate): Secondary | ICD-10-CM | POA: Diagnosis not present

## 2018-03-25 DIAGNOSIS — Z9189 Other specified personal risk factors, not elsewhere classified: Secondary | ICD-10-CM | POA: Diagnosis not present

## 2018-03-25 DIAGNOSIS — Z79891 Long term (current) use of opiate analgesic: Secondary | ICD-10-CM | POA: Diagnosis not present

## 2018-03-25 DIAGNOSIS — F900 Attention-deficit hyperactivity disorder, predominantly inattentive type: Secondary | ICD-10-CM | POA: Diagnosis not present

## 2018-03-25 DIAGNOSIS — E782 Mixed hyperlipidemia: Secondary | ICD-10-CM | POA: Diagnosis not present

## 2018-03-25 DIAGNOSIS — N183 Chronic kidney disease, stage 3 (moderate): Secondary | ICD-10-CM | POA: Diagnosis not present

## 2018-03-25 DIAGNOSIS — Z23 Encounter for immunization: Secondary | ICD-10-CM | POA: Diagnosis not present

## 2018-03-25 DIAGNOSIS — Z6829 Body mass index (BMI) 29.0-29.9, adult: Secondary | ICD-10-CM | POA: Diagnosis not present

## 2018-03-25 DIAGNOSIS — F5221 Male erectile disorder: Secondary | ICD-10-CM | POA: Diagnosis not present

## 2018-03-25 DIAGNOSIS — F331 Major depressive disorder, recurrent, moderate: Secondary | ICD-10-CM | POA: Diagnosis not present

## 2018-03-25 DIAGNOSIS — I10 Essential (primary) hypertension: Secondary | ICD-10-CM | POA: Diagnosis not present

## 2018-04-08 DIAGNOSIS — E291 Testicular hypofunction: Secondary | ICD-10-CM | POA: Diagnosis not present

## 2018-04-08 DIAGNOSIS — L570 Actinic keratosis: Secondary | ICD-10-CM | POA: Diagnosis not present

## 2018-04-22 DIAGNOSIS — N529 Male erectile dysfunction, unspecified: Secondary | ICD-10-CM | POA: Diagnosis not present

## 2018-05-13 DIAGNOSIS — E291 Testicular hypofunction: Secondary | ICD-10-CM | POA: Diagnosis not present

## 2018-06-21 DIAGNOSIS — M79672 Pain in left foot: Secondary | ICD-10-CM | POA: Diagnosis not present

## 2018-06-21 DIAGNOSIS — I10 Essential (primary) hypertension: Secondary | ICD-10-CM | POA: Diagnosis not present

## 2018-06-21 DIAGNOSIS — E782 Mixed hyperlipidemia: Secondary | ICD-10-CM | POA: Diagnosis not present

## 2018-06-21 DIAGNOSIS — F5221 Male erectile disorder: Secondary | ICD-10-CM | POA: Diagnosis not present

## 2018-06-21 DIAGNOSIS — F900 Attention-deficit hyperactivity disorder, predominantly inattentive type: Secondary | ICD-10-CM | POA: Diagnosis not present

## 2018-06-21 DIAGNOSIS — Z9189 Other specified personal risk factors, not elsewhere classified: Secondary | ICD-10-CM | POA: Diagnosis not present

## 2018-06-21 DIAGNOSIS — F331 Major depressive disorder, recurrent, moderate: Secondary | ICD-10-CM | POA: Diagnosis not present

## 2018-06-21 DIAGNOSIS — N183 Chronic kidney disease, stage 3 (moderate): Secondary | ICD-10-CM | POA: Diagnosis not present

## 2018-06-21 DIAGNOSIS — Z6828 Body mass index (BMI) 28.0-28.9, adult: Secondary | ICD-10-CM | POA: Diagnosis not present

## 2018-06-21 DIAGNOSIS — Z23 Encounter for immunization: Secondary | ICD-10-CM | POA: Diagnosis not present

## 2018-07-04 DIAGNOSIS — S90852A Superficial foreign body, left foot, initial encounter: Secondary | ICD-10-CM | POA: Diagnosis not present

## 2018-07-05 DIAGNOSIS — F5221 Male erectile disorder: Secondary | ICD-10-CM | POA: Diagnosis not present

## 2018-07-06 ENCOUNTER — Other Ambulatory Visit: Payer: Self-pay | Admitting: Podiatry

## 2018-07-11 DIAGNOSIS — S90852A Superficial foreign body, left foot, initial encounter: Secondary | ICD-10-CM | POA: Diagnosis not present

## 2018-07-11 DIAGNOSIS — M79672 Pain in left foot: Secondary | ICD-10-CM | POA: Diagnosis not present

## 2018-07-11 NOTE — Patient Instructions (Signed)
Edward Jacobson  07/11/2018     @PREFPERIOPPHARMACY @   Your procedure is scheduled on  07/13/2018 .  Report to [facility] at  615  A.M.  Call this number if you have problems the morning of surgery:  [phone]   Remember:  Do not eat or drink after midnight.  You may drink clear liquids until  12 midnight 07/12/2018 .  Clear liquids allowed are:                    Water, Juice (non-citric and without pulp), Carbonated beverages, Clear Tea, Black Coffee only, Plain Jell-O only, Gatorade and Plain Popsicles only    Take these medicines the morning of surgery with A SIP OF WATER  Wellbutrin, and prozac.    Do not wear jewelry, make-up or nail polish.  Do not wear lotions, powders, or perfumes, or deodorant.  Do not shave 48 hours prior to surgery.  Men may shave face and neck.  Do not bring valuables to the hospital.  [facility] is not responsible for any belongings or valuables.  Contacts, dentures or bridgework may not be worn into surgery.  Leave your suitcase in the car.  After surgery it may be brought to your room.  For patients admitted to the hospital, discharge time will be determined by your treatment team.  Patients discharged the day of surgery will not be allowed to drive home.   Name and phone number of your driver:   family Special instructions:  None  Please read over the following fact sheets that you were given. Anesthesia Post-op Instructions and Care and Recovery After Surgery      Sliver Removal, Care After A sliver-also called a splinter-is a small and thin broken piece of an object that gets stuck (embedded) under the skin. A sliver can create a deep wound that can easily become infected. It is important to care for the wound after a sliver is removed to help prevent infection and other problems from developing. What can I expect after the procedure? Slivers often break into smaller pieces when they are removed. If pieces of your sliver  broke off and stayed in your skin, you will eventually see them working themselves out and you may feel some pain at the wound site. This is normal. Follow these instructions at home:  Keep all follow-up visits as directed by your health care provider. This is important.  There are many different ways to close and cover a wound, including stitches (sutures) and adhesive strips. Follow your health care provider's instructions about: ? Wound care. ? Bandage (dressing) changes and removal. ? Wound closure removal.  Check the wound site every day for signs of infection. Watch for: ? Red streaks coming from the wound. ? Fever. ? Redness or tenderness around the wound. ? Fluid, blood, or pus coming from the wound. ? A bad smell coming from the wound. Contact a health care provider if:  You think that a piece of the sliver is still in your skin.  Your wound was closed, as with sutures, and the edges of the wound break open.  You have signs of infection, including: ? New or worsening redness around the wound. ? New or worsening tenderness around the wound. ? Fluid, blood, or pus coming from the wound. ? A bad smell coming from the wound or dressing. Get help right away if: You have any of the following  signs of infection:  Red streaks coming from the wound.  An unexplained fever.  This information is not intended to replace advice given to you by your health care provider. Make sure you discuss any questions you have with your health care provider. Document Released: 12/11/2000 Document Revised: 08/09/2016 Document Reviewed: 08/16/2014 Elsevier Interactive Patient Education  2018 Elsevier Inc[employer]  Warden/ranger, Adult A foreign body is an object that gets into your body that should not be there. Your hands and feet are common entry points for foreign bodies. Common examples of foreign bodies include:  Splinters.  Thorns.  Pieces of glass, wood, metal, or  plastic.  Metal objects such as needles, nails, and fishing hooks.  Foreign bodies that are not removed quickly may cause infection. What are the causes? A foreign body can get in your hand or foot through an injury, such as a scrape or cut, or if something punctures your skin. What are the signs or symptoms? Symptoms of having recently gotten a foreign body in your hand or foot include:  Pain.  Redness.  Swelling.  Tenderness.  Numbness or tingling.  Noticing something under the skin.  If the foreign body has caused an infection, symptoms may also include:  Fever.  Warmth in the area of the puncture.  A lump that you can feel under the skin.  Not being able to use your hand or put weight (bear weight) on your foot.  Greenish or yellow fluid coming from the puncture area.  How is this diagnosed? Your health care provider will diagnose a foreign body in the hand or foot by taking a medical history and examining your wound. If the foreign body is deep, you may also have tests, such as an X-ray, MRI, or ultrasound. In some cases, your health care provider may need to numb the area, open it up to examine what is inside your skin, and remove the object. How is this treated? Your health care provider may be able to remove the foreign body while exploring your wound during the diagnosis. If the foreign body is deep or in a wound that is infected, you may need to have a procedure to remove it. If an incision is needed, it may be closed with stitches (sutures), skin glue, or adhesive strips. A bandage (dressing) will be placed over the incision. You may also need to get a tetanus shot or take antibiotic medicines to prevent or treat an infection. Follow these instructions at home: Medicines  Take over-the-counter and prescription medicines only as told by your health care provider.  If you were prescribed an antibiotic medicine, take the antibiotic as told by your health care  provider. Do not stop taking the antibiotic even if you start to feel better. Wound care  Change your dressing as told by your health care provider.  Follow instructions from your health care provider about how to take care of your wound or incision. Make sure you: ? Wash your hands with soap and water before you change your dressing. If soap and water are not available, use hand sanitizer. ? Change your dressing as told by your health care provider. ? Leave sutures, skin glue, or adhesive strips in place. These skin closures may need to stay in place for 2 weeks or longer. If adhesive strip edges start to loosen and curl up, you may trim the loose edges. Do not remove adhesive strips completely unless your health care provider tells you to do  that.  Check your puncture area or incision every day for signs of infection. Check for: ? More redness, swelling, or pain. ? More fluid or blood. ? Warmth. ? Pus or a bad smell. General instructions  Return to your normal activities as told by your health care provider. Ask your health care provider what activities are safe for you.  Do not take baths, swim, or use a hot tub until your health care provider approves. Ask your health care provider if you may take showers. You may only be allowed to take sponge baths.  Keep all follow-up visits as told by your health care provider. This is important. How is this prevented? To protect yourself:  Wear gloves when working with sharp objects.  Do not walk barefoot in areas where there may be sharp objects.  Wear thick-soled shoes or boots when walking in areas where there are sharp objects.  Contact a health care provider if:  You have a foreign body that has not come out or been removed.  You have more redness, swelling, or pain around the puncture area or incision.  You have more fluid or blood coming from the puncture area or incision.  Your puncture area or incision feels warm to the  touch.  You have pus or a bad smell coming from the puncture area or incision.  You have a fever.  You were given a tetanus shot, and you have swelling, severe pain, redness, or bleeding at the injection site. Get help right away if:  Your puncture area becomes numb.  You cannot use your hand or foot. Summary  A foreign body is an object that gets into your body that should not be there. Your hands and feet are common entry points for foreign bodies.  Your health care provider may be able to remove the foreign body while exploring your wound during the diagnosis. If the foreign body is deep or in a wound that is infected, you may need to have a procedure to remove it.  You may also need to get a tetanus shot or take antibiotic medicines to prevent or treat an infection.  Check your puncture area or incision every day for signs of infection. This information is not intended to replace advice given to you by your health care provider. Make sure you discuss any questions you have with your health care provider. Document Released: 03/10/2017 Document Revised: 03/10/2017 Document Reviewed: 03/10/2017 Elsevier Interactive Patient Education  2018 [place] Anesthesia is a term that refers to techniques, procedures, and medicines that help a person stay safe and comfortable during a medical procedure. Monitored anesthesia care, or sedation, is one type of anesthesia. Your anesthesia specialist may recommend sedation if you will be having a procedure that does not require you to be unconscious, such as:  Cataract surgery.  A dental procedure.  A biopsy.  A colonoscopy.  During the procedure, you may receive a medicine to help you relax (sedative). There are three levels of sedation:  Mild sedation. At this level, you may feel awake and relaxed. You will be able to follow directions.  Moderate sedation. At this level, you will be sleepy. You may not  remember the procedure.  Deep sedation. At this level, you will be asleep. You will not remember the procedure.  The more medicine you are given, the deeper your level of sedation will be. Depending on how you respond to the procedure, the anesthesia specialist may change your  level of sedation or the type of anesthesia to fit your needs. An anesthesia specialist will monitor you closely during the procedure. Let your health care provider know about:  Any allergies you have.  All medicines you are taking, including vitamins, herbs, eye drops, creams, and over-the-counter medicines.  Any use of steroids (by mouth or as a cream).  Any problems you or family members have had with sedatives and anesthetic medicines.  Any blood disorders you have.  Any surgeries you have had.  Any medical conditions you have, such as sleep apnea.  Whether you are pregnant or may be pregnant.  Any use of cigarettes, alcohol, or street drugs. What are the risks? Generally, this is a safe procedure. However, problems may occur, including:  Getting too much medicine (oversedation).  Nausea.  Allergic reaction to medicines.  Trouble breathing. If this happens, a breathing tube may be used to help with breathing. It will be removed when you are awake and breathing on your own.  Heart trouble.  Lung trouble.  Before the procedure Staying hydrated Follow instructions from your health care provider about hydration, which may include:  Up to 2 hours before the procedure - you may continue to drink clear liquids, such as water, clear fruit juice, black coffee, and plain tea.  Eating and drinking restrictions Follow instructions from your health care provider about eating and drinking, which may include:  8 hours before the procedure - stop eating heavy meals or foods such as meat, fried foods, or fatty foods.  6 hours before the procedure - stop eating light meals or foods, such as toast or  cereal.  6 hours before the procedure - stop drinking milk or drinks that contain milk.  2 hours before the procedure - stop drinking clear liquids.  Medicines Ask your health care provider about:  Changing or stopping your regular medicines. This is especially important if you are taking diabetes medicines or blood thinners.  Taking medicines such as aspirin and ibuprofen. These medicines can thin your blood. Do not take these medicines before your procedure if your health care provider instructs you not to.  Tests and exams  You will have a physical exam.  You may have blood tests done to show: ? How well your kidneys and liver are working. ? How well your blood can clot.  General instructions  Plan to have someone take you home from the hospital or clinic.  If you will be going home right after the procedure, plan to have someone with you for 24 hours.  What happens during the procedure?  Your blood pressure, heart rate, breathing, level of pain and overall condition will be monitored.  An IV tube will be inserted into one of your veins.  Your anesthesia specialist will give you medicines as needed to keep you comfortable during the procedure. This may mean changing the level of sedation.  The procedure will be performed. After the procedure  Your blood pressure, heart rate, breathing rate, and blood oxygen level will be monitored until the medicines you were given have worn off.  Do not drive for 24 hours if you received a sedative.  You may: ? Feel sleepy, clumsy, or nauseous. ? Feel forgetful about what happened after the procedure. ? Have a sore throat if you had a breathing tube during the procedure. ? Vomit. This information is not intended to replace advice given to you by your health care provider. Make sure you discuss any questions you have  with your health care provider. Document Released: 09/09/2005 Document Revised: 05/22/2016 Document Reviewed:  04/05/2016 Elsevier Interactive Patient Education  2018 [place], Care After These instructions provide you with information about caring for yourself after your procedure. Your health care provider may also give you more specific instructions. Your treatment has been planned according to current medical practices, but problems sometimes occur. Call your health care provider if you have any problems or questions after your procedure. What can I expect after the procedure? After your procedure, it is common to:  Feel sleepy for several hours.  Feel clumsy and have poor balance for several hours.  Feel forgetful about what happened after the procedure.  Have poor judgment for several hours.  Feel nauseous or vomit.  Have a sore throat if you had a breathing tube during the procedure.  Follow these instructions at home: For at least 24 hours after the procedure:   Do not: ? Participate in activities in which you could fall or become injured. ? Drive. ? Use heavy machinery. ? Drink alcohol. ? Take sleeping pills or medicines that cause drowsiness. ? Make important decisions or sign legal documents. ? Take care of children on your own.  Rest. Eating and drinking  Follow the diet that is recommended by your health care provider.  If you vomit, drink water, juice, or soup when you can drink without vomiting.  Make sure you have little or no nausea before eating solid foods. General instructions  Have a responsible adult stay with you until you are awake and alert.  Take over-the-counter and prescription medicines only as told by your health care provider.  If you smoke, do not smoke without supervision.  Keep all follow-up visits as told by your health care provider. This is important. Contact a health care provider if:  You keep feeling nauseous or you keep vomiting.  You feel light-headed.  You develop a rash.  You have a fever. Get  help right away if:  You have trouble breathing. This information is not intended to replace advice given to you by your health care provider. Make sure you discuss any questions you have with your health care provider. Document Released: 04/05/2016 Document Revised: 08/05/2016 Document Reviewed: 04/05/2016 Elsevier Interactive Patient Education  [employer].

## 2018-07-12 ENCOUNTER — Encounter (HOSPITAL_COMMUNITY)
Admission: RE | Admit: 2018-07-12 | Discharge: 2018-07-12 | Disposition: A | Discharge status: Home / Self Care | Payer: Medicare Other | Source: Ambulatory Visit | Attending: Podiatry | Admitting: Podiatry

## 2018-07-12 ENCOUNTER — Ambulatory Visit (HOSPITAL_COMMUNITY)
Admission: RE | Admit: 2018-07-12 | Discharge: 2018-07-12 | Disposition: A | Discharge status: Home / Self Care | Payer: Medicare Other | Source: Ambulatory Visit | Attending: Podiatry | Admitting: Podiatry

## 2018-07-12 ENCOUNTER — Encounter (HOSPITAL_COMMUNITY): Payer: Self-pay

## 2018-07-12 ENCOUNTER — Other Ambulatory Visit: Payer: Self-pay

## 2018-07-12 ENCOUNTER — Other Ambulatory Visit (HOSPITAL_COMMUNITY): Payer: Self-pay | Admitting: Podiatry

## 2018-07-12 DIAGNOSIS — M795 Residual foreign body in soft tissue: Secondary | ICD-10-CM

## 2018-07-12 DIAGNOSIS — M79672 Pain in left foot: Secondary | ICD-10-CM | POA: Diagnosis not present

## 2018-07-12 HISTORY — DX: Major depressive disorder, single episode, unspecified: F32.9

## 2018-07-12 HISTORY — DX: Depression, unspecified: F32.A

## 2018-07-12 HISTORY — DX: Anxiety disorder, unspecified: F41.9

## 2018-07-12 LAB — CBC WITH DIFFERENTIAL/PLATELET
Basophils Absolute: 0 10*3/uL (ref 0.0–0.1)
Basophils Relative: 0 %
EOS ABS: 0.1 10*3/uL (ref 0.0–0.7)
Eosinophils Relative: 1 %
HCT: 43.4 % (ref 39.0–52.0)
HEMOGLOBIN: 15.1 g/dL (ref 13.0–17.0)
LYMPHS ABS: 3.1 10*3/uL (ref 0.7–4.0)
LYMPHS PCT: 28 %
MCH: 29.7 pg (ref 26.0–34.0)
MCHC: 34.8 g/dL (ref 30.0–36.0)
MCV: 85.4 fL (ref 78.0–100.0)
Monocytes Absolute: 0.9 10*3/uL (ref 0.1–1.0)
Monocytes Relative: 8 %
NEUTROS PCT: 63 %
Neutro Abs: 7 10*3/uL (ref 1.7–7.7)
Platelets: 233 10*3/uL (ref 150–400)
RBC: 5.08 MIL/uL (ref 4.22–5.81)
RDW: 13.6 % (ref 11.5–15.5)
WBC: 11.1 10*3/uL — AB (ref 4.0–10.5)

## 2018-07-12 LAB — BASIC METABOLIC PANEL
Anion gap: 6 (ref 5–15)
BUN: 17 mg/dL (ref 8–23)
CALCIUM: 8.4 mg/dL — AB (ref 8.9–10.3)
CO2: 28 mmol/L (ref 22–32)
Chloride: 105 mmol/L (ref 98–111)
Creatinine, Ser: 1.59 mg/dL — ABNORMAL HIGH (ref 0.61–1.24)
GFR calc Af Amer: 52 mL/min — ABNORMAL LOW (ref >60)
GFR calc non Af Amer: 45 mL/min — ABNORMAL LOW (ref >60)
Glucose, Bld: 108 mg/dL — ABNORMAL HIGH (ref 70–99)
POTASSIUM: 3.7 mmol/L (ref 3.5–5.1)
SODIUM: 139 mmol/L (ref 135–145)

## 2018-07-13 ENCOUNTER — Ambulatory Visit (HOSPITAL_COMMUNITY): Payer: Medicare Other | Admitting: Anesthesiology

## 2018-07-13 ENCOUNTER — Ambulatory Visit (HOSPITAL_COMMUNITY): Payer: Medicare Other

## 2018-07-13 ENCOUNTER — Other Ambulatory Visit: Payer: Self-pay

## 2018-07-13 ENCOUNTER — Encounter (HOSPITAL_COMMUNITY)
Admission: RE | Disposition: A | Discharge status: Home / Self Care | Payer: Self-pay | Source: Ambulatory Visit | Attending: Podiatry

## 2018-07-13 ENCOUNTER — Encounter (HOSPITAL_COMMUNITY): Payer: Self-pay | Admitting: *Deleted

## 2018-07-13 ENCOUNTER — Ambulatory Visit (HOSPITAL_COMMUNITY)
Admission: RE | Admit: 2018-07-13 | Discharge: 2018-07-13 | Disposition: A | Discharge status: Home / Self Care | Payer: Medicare Other | Source: Ambulatory Visit | Attending: Podiatry | Admitting: Podiatry

## 2018-07-13 DIAGNOSIS — M79672 Pain in left foot: Secondary | ICD-10-CM | POA: Diagnosis not present

## 2018-07-13 DIAGNOSIS — S90852A Superficial foreign body, left foot, initial encounter: Secondary | ICD-10-CM | POA: Diagnosis not present

## 2018-07-13 DIAGNOSIS — T81509A Unspecified complication of foreign body accidentally left in body following unspecified procedure, initial encounter: Secondary | ICD-10-CM

## 2018-07-13 DIAGNOSIS — X58XXXA Exposure to other specified factors, initial encounter: Secondary | ICD-10-CM | POA: Diagnosis not present

## 2018-07-13 DIAGNOSIS — Z9889 Other specified postprocedural states: Secondary | ICD-10-CM

## 2018-07-13 HISTORY — PX: FOREIGN BODY REMOVAL: SHX962

## 2018-07-13 SURGERY — REMOVAL FOREIGN BODY EXTREMITY
Anesthesia: Monitor Anesthesia Care | Site: Foot | Laterality: Left

## 2018-07-13 MED ORDER — LIDOCAINE HCL (PF) 1 % IJ SOLN
INTRAMUSCULAR | Status: DC | PRN
  Administered 2018-07-13: 10 mL

## 2018-07-13 MED ORDER — LIDOCAINE HCL (PF) 1 % IJ SOLN
INTRAMUSCULAR | Status: AC
  Filled 2018-07-13: qty 30

## 2018-07-13 MED ORDER — CLINDAMYCIN PHOSPHATE 600 MG/50ML IV SOLN
600.0000 mg | Freq: Once | INTRAVENOUS | Status: AC
  Administered 2018-07-13: 600 mg via INTRAVENOUS

## 2018-07-13 MED ORDER — SUGAMMADEX SODIUM 200 MG/2ML IV SOLN
INTRAVENOUS | Status: AC
  Filled 2018-07-13: qty 4

## 2018-07-13 MED ORDER — SUCCINYLCHOLINE CHLORIDE 20 MG/ML IJ SOLN
INTRAMUSCULAR | Status: AC
  Filled 2018-07-13: qty 1

## 2018-07-13 MED ORDER — CLINDAMYCIN PHOSPHATE 600 MG/50ML IV SOLN
INTRAVENOUS | Status: AC
  Filled 2018-07-13: qty 50

## 2018-07-13 MED ORDER — ETOMIDATE 2 MG/ML IV SOLN
INTRAVENOUS | Status: AC
  Filled 2018-07-13: qty 10

## 2018-07-13 MED ORDER — MIDAZOLAM HCL 5 MG/5ML IJ SOLN
INTRAMUSCULAR | Status: DC | PRN
  Administered 2018-07-13: 2 mg via INTRAVENOUS

## 2018-07-13 MED ORDER — LACTATED RINGERS IV SOLN
INTRAVENOUS | Status: DC
  Administered 2018-07-13: 08:00:00 via INTRAVENOUS

## 2018-07-13 MED ORDER — FENTANYL CITRATE (PF) 100 MCG/2ML IJ SOLN
INTRAMUSCULAR | Status: DC | PRN
  Administered 2018-07-13 (×2): 25 ug via INTRAVENOUS

## 2018-07-13 MED ORDER — PHENYLEPHRINE HCL 10 MG/ML IJ SOLN
INTRAMUSCULAR | Status: AC
  Filled 2018-07-13: qty 1

## 2018-07-13 MED ORDER — PROPOFOL 500 MG/50ML IV EMUL
INTRAVENOUS | Status: DC | PRN
  Administered 2018-07-13: 50 ug/kg/min via INTRAVENOUS
  Administered 2018-07-13: 100 ug/kg/min via INTRAVENOUS

## 2018-07-13 MED ORDER — LIDOCAINE HCL (PF) 0.5 % IJ SOLN
INTRAMUSCULAR | Status: AC
  Filled 2018-07-13: qty 50

## 2018-07-13 MED ORDER — BUPIVACAINE HCL (PF) 0.5 % IJ SOLN
INTRAMUSCULAR | Status: DC | PRN
  Administered 2018-07-13: 10 mL

## 2018-07-13 MED ORDER — MIDAZOLAM HCL 2 MG/2ML IJ SOLN
INTRAMUSCULAR | Status: AC
  Filled 2018-07-13: qty 2

## 2018-07-13 MED ORDER — HYDROCODONE-ACETAMINOPHEN 7.5-325 MG PO TABS: 1.0000 | ORAL_TABLET | Freq: Once | ORAL | Status: DC | PRN

## 2018-07-13 MED ORDER — FENTANYL CITRATE (PF) 100 MCG/2ML IJ SOLN
INTRAMUSCULAR | Status: AC
  Filled 2018-07-13: qty 2

## 2018-07-13 MED ORDER — PROPOFOL 10 MG/ML IV BOLUS
INTRAVENOUS | Status: AC
  Filled 2018-07-13: qty 20

## 2018-07-13 MED ORDER — ONDANSETRON HCL 4 MG/2ML IJ SOLN
INTRAMUSCULAR | Status: AC
  Filled 2018-07-13: qty 6

## 2018-07-13 MED ORDER — PROPOFOL 10 MG/ML IV BOLUS
INTRAVENOUS | Status: DC | PRN
  Administered 2018-07-13: 30 mg via INTRAVENOUS
  Administered 2018-07-13: 20 mg via INTRAVENOUS

## 2018-07-13 MED ORDER — LIDOCAINE HCL (PF) 1 % IJ SOLN
INTRAMUSCULAR | Status: AC
  Filled 2018-07-13: qty 15

## 2018-07-13 MED ORDER — ROCURONIUM BROMIDE 50 MG/5ML IV SOLN
INTRAVENOUS | Status: AC
  Filled 2018-07-13: qty 4

## 2018-07-13 MED ORDER — BUPIVACAINE HCL (PF) 0.5 % IJ SOLN
INTRAMUSCULAR | Status: AC
  Filled 2018-07-13: qty 30

## 2018-07-13 MED ORDER — 0.9 % SODIUM CHLORIDE (POUR BTL) OPTIME
TOPICAL | Status: DC | PRN
  Administered 2018-07-13: 1000 mL

## 2018-07-13 MED ORDER — FENTANYL CITRATE (PF) 100 MCG/2ML IJ SOLN: 25.0000 ug | INTRAMUSCULAR | Status: DC | PRN

## 2018-07-13 SURGICAL SUPPLY — 38 items
APL SKNCLS STERI-STRIP NONHPOA (GAUZE/BANDAGES/DRESSINGS) ×1
BANDAGE ELASTIC 4 LF NS (GAUZE/BANDAGES/DRESSINGS) ×2 IMPLANT
BANDAGE ESMARK 4X12 BL STRL LF (DISPOSABLE) ×1 IMPLANT
BENZOIN TINCTURE PRP APPL 2/3 (GAUZE/BANDAGES/DRESSINGS) ×3 IMPLANT
BLADE 15 SAFETY STRL DISP (BLADE) ×3 IMPLANT
BNDG CMPR 12X4 ELC STRL LF (DISPOSABLE) ×1
BNDG CMPR MED 5X4 ELC HKLP NS (GAUZE/BANDAGES/DRESSINGS) ×1
BNDG CONFORM 2 STRL LF (GAUZE/BANDAGES/DRESSINGS) ×2 IMPLANT
BNDG ESMARK 4X12 BLUE STRL LF (DISPOSABLE) ×3
BNDG GAUZE ELAST 4 BULKY (GAUZE/BANDAGES/DRESSINGS) ×3 IMPLANT
BOOT STEPPER DURA LG (SOFTGOODS) ×2 IMPLANT
CHLORAPREP W/TINT 26ML (MISCELLANEOUS) ×3 IMPLANT
CLOSURE WOUND 1/2 X4 (GAUZE/BANDAGES/DRESSINGS) ×1
CLOTH BEACON ORANGE TIMEOUT ST (SAFETY) ×3 IMPLANT
COVER LIGHT HANDLE STERIS (MISCELLANEOUS) ×6 IMPLANT
CUFF TOURNIQUET SINGLE 18IN (TOURNIQUET CUFF) IMPLANT
DECANTER SPIKE VIAL GLASS SM (MISCELLANEOUS) ×5 IMPLANT
DRAPE OEC MINIVIEW 54X84 (DRAPES) ×3 IMPLANT
DRSG ADAPTIC 3X8 NADH LF (GAUZE/BANDAGES/DRESSINGS) ×3 IMPLANT
GAUZE SPONGE 4X4 12PLY STRL (GAUZE/BANDAGES/DRESSINGS) ×3 IMPLANT
GLOVE BIOGEL PI IND STRL 7.0 (GLOVE) ×1 IMPLANT
GLOVE BIOGEL PI IND STRL 7.5 (GLOVE) ×1 IMPLANT
GLOVE BIOGEL PI INDICATOR 7.0 (GLOVE) ×6
GLOVE BIOGEL PI INDICATOR 7.5 (GLOVE) ×2
GLOVE ECLIPSE 7.0 STRL STRAW (GLOVE) ×3 IMPLANT
GOWN STRL REUS W/TWL LRG LVL3 (GOWN DISPOSABLE) ×4 IMPLANT
KIT TURNOVER KIT A (KITS) ×3 IMPLANT
MARKER SKIN DUAL TIP RULER LAB (MISCELLANEOUS) ×3 IMPLANT
NS IRRIG 1000ML POUR BTL (IV SOLUTION) ×3 IMPLANT
PACK BASIC LIMB (CUSTOM PROCEDURE TRAY) ×3 IMPLANT
PAD ABD 5X9 TENDERSORB (GAUZE/BANDAGES/DRESSINGS) ×2 IMPLANT
PAD ARMBOARD 7.5X6 YLW CONV (MISCELLANEOUS) ×3 IMPLANT
SET BASIN LINEN APH (SET/KITS/TRAYS/PACK) ×3 IMPLANT
STRIP CLOSURE SKIN 1/2X4 (GAUZE/BANDAGES/DRESSINGS) ×1 IMPLANT
SUT MON AB 2-0 CT1 36 (SUTURE) ×1 IMPLANT
SUT VIC AB 4-0 PS2 27 (SUTURE) ×1 IMPLANT
SUT VICRYL AB 3-0 FS1 BRD 27IN (SUTURE) ×1 IMPLANT
SYR CONTROL 10ML LL (SYRINGE) ×6 IMPLANT

## 2018-07-13 NOTE — Op Note (Signed)
07/13/2018  9:50 AM  PATIENT:  Edward Jacobson  62 y.o. male  PRE-OPERATIVE DIAGNOSIS:  foreign body  POST-OPERATIVE DIAGNOSIS:  foreign body left foot  PROCEDURE:  Procedure(s): REMOVAL FOREIGN BODY LEFT FOOT (Left)  SURGEON:  Surgeon(s) and Role:    * Jovanka Westgate, Dance movement psychotherapist, DPM - Primary   ASSISTANTS: none   ANESTHESIA:   local and MAC  EBL:  0 mL   BLOOD ADMINISTERED:none  DRAINS: none   LOCAL MEDICATIONS USED:  MARCAINE   , LIDOCAINE  and Amount: 10 ml  SPECIMEN:  No Specimen  DISPOSITION OF SPECIMEN:  N/A  COUNTS:  YES  TOURNIQUET:   Total Tourniquet Time Documented: Calf (Left) - 95 minutes Total: Calf (Left) - 95 minutes   DICTATION: .Viviann Spare Dictation  PLAN OF CARE: Discharge to home after PACU  PATIENT DISPOSITION:  PACU - hemodynamically stable.   Delay start of Pharmacological VTE agent (>24hrs) due to surgical blood loss or risk of bleeding: not applicable  .Patient was brought into the operating room laid supine on the operating table. Ankle tourniquet was applied to the surgical extremity. Following IV sedation, a local block was achieved using 10 cc of mixture of 1% plain lidocaine with 0.5% marcaine. The foot was the prepped, scrubbed and draped in aseptic manner. The surgical foot and leg was elevated for 3 minute and the tourniquet was inflated at 272mHg.   There was open wound noted on the plantar aspect of the left 2nd MPJ. A stab incision was made through the opening. Using fluoroscopy the foreign body, which was glass was recovered and removed. Incision site was flushed with copious amount of saline. Incision was closed using 3-0 Nylon. Dry sterile dressing was applied. Tourniquet was deflated. Capillary refill time was brisk to the toe. Patient will be in cam walker with partial weightbearing to heel.

## 2018-07-13 NOTE — Anesthesia Postprocedure Evaluation (Signed)
Anesthesia Post Note  Patient: Edward Jacobson  Procedure(s) Performed: REMOVAL FOREIGN BODY LEFT FOOT (Left Foot)  Patient location during evaluation: PACU Anesthesia Type: MAC Level of consciousness: awake and alert and oriented Pain management: pain level controlled Vital Signs Assessment: post-procedure vital signs reviewed and stable Respiratory status: spontaneous breathing Cardiovascular status: blood pressure returned to baseline and stable Postop Assessment: no apparent nausea or vomiting and adequate PO intake Anesthetic complications: no     Last Vitals:  Vitals:   07/13/18 0950 07/13/18 1000  BP: 115/76 118/80  Pulse: 73 71  Resp: 14 11  Temp: 36.6 C   SpO2: 99% 99%    Last Pain:  Vitals:   07/13/18 0950  TempSrc:   PainSc: 0-No pain                 Alyssa Rotondo

## 2018-07-13 NOTE — Anesthesia Preprocedure Evaluation (Signed)
Anesthesia Evaluation  Patient identified by MRN, date of birth, ID band Patient awake    Reviewed: Allergy & Precautions, NPO status , Patient's Chart, lab work & pertinent test results  Airway Mallampati: II  TM Distance: >3 FB Neck ROM: Full    Dental no notable dental hx.    Pulmonary neg pulmonary ROS,    Pulmonary exam normal breath sounds clear to auscultation       Cardiovascular Exercise Tolerance: Good negative cardio ROS Normal cardiovascular examI Rhythm:Regular Rate:Normal     Neuro/Psych Anxiety Depression negative neurological ROS  negative psych ROS   GI/Hepatic negative GI ROS, Neg liver ROS,   Endo/Other  negative endocrine ROS  Renal/GU Renal InsufficiencyRenal diseasenegative Renal ROS  negative genitourinary   Musculoskeletal negative musculoskeletal ROS (+)   Abdominal   Peds negative pediatric ROS (+)  Hematology negative hematology ROS (+)   Anesthesia Other Findings   Reproductive/Obstetrics negative OB ROS                             Anesthesia Physical Anesthesia Plan  ASA: II  Anesthesia Plan: MAC   Post-op Pain Management:    Induction: Intravenous  PONV Risk Score and Plan:   Airway Management Planned: Simple Face Mask  Additional Equipment:   Intra-op Plan:   Post-operative Plan:   Informed Consent: I have reviewed the patients History and Physical, chart, labs and discussed the procedure including the risks, benefits and alternatives for the proposed anesthesia with the patient or authorized representative who has indicated his/her understanding and acceptance.   Dental advisory given  Plan Discussed with:   Anesthesia Plan Comments: (Mac vs GA as needed)        Anesthesia Quick Evaluation

## 2018-07-13 NOTE — Transfer of Care (Signed)
Immediate Anesthesia Transfer of Care Note  Patient: Edward Jacobson  Procedure(s) Performed: REMOVAL FOREIGN BODY LEFT FOOT (Left Foot)  Patient Location: PACU  Anesthesia Type:MAC  Level of Consciousness: awake, alert  and oriented  Airway & Oxygen Therapy: Patient Spontanous Breathing  Post-op Assessment: Report given to RN  Post vital signs: Reviewed and stable  Last Vitals:  Vitals Value Taken Time  BP 115/76 07/13/2018  9:50 AM  Temp 36.6 C 07/13/2018  9:50 AM  Pulse 68 07/13/2018  9:50 AM  Resp 10 07/13/2018  9:50 AM  SpO2 99 % 07/13/2018  9:50 AM  Vitals shown include unvalidated device data.  Last Pain:  Vitals:   07/13/18 0625  TempSrc: Oral  PainSc: 0-No pain         Complications: No apparent anesthesia complications

## 2018-07-13 NOTE — Discharge Instructions (Signed)

## 2018-07-13 NOTE — Brief Op Note (Signed)
07/13/2018  9:50 AM  PATIENT:  Edward Jacobson  62 y.o. male  PRE-OPERATIVE DIAGNOSIS:  foreign body  POST-OPERATIVE DIAGNOSIS:  foreign body left foot  PROCEDURE:  Procedure(s): REMOVAL FOREIGN BODY LEFT FOOT (Left)  SURGEON:  Surgeon(s) and Role:    * Murice Barbar, Dance movement psychotherapist, DPM - Primary   ASSISTANTS: none   ANESTHESIA:   local and MAC  EBL:  0 mL   BLOOD ADMINISTERED:none  DRAINS: none   LOCAL MEDICATIONS USED:  MARCAINE   , LIDOCAINE  and Amount: 10 ml  SPECIMEN:  No Specimen  DISPOSITION OF SPECIMEN:  N/A  COUNTS:  YES  TOURNIQUET:   Total Tourniquet Time Documented: Calf (Left) - 95 minutes Total: Calf (Left) - 95 minutes   DICTATION: .Viviann Spare Dictation  PLAN OF CARE: Discharge to home after PACU  PATIENT DISPOSITION:  PACU - hemodynamically stable.   Delay start of Pharmacological VTE agent (>24hrs) due to surgical blood loss or risk of bleeding: not applicable

## 2018-07-13 NOTE — H&P (Signed)
HISTORY AND PHYSICAL INTERVAL NOTE:  07/13/2018  7:12 AM  Corrinne Eagle  has presented today for surgery, with the diagnosis of foreign body in left foot.  The various methods of treatment have been discussed with the patient.  No guarantees were given.  After consideration of risks, benefits and other options for treatment, the patient has consented to surgery.  I have reviewed the patients' chart and labs.    Patient Vitals for the past 24 hrs:  BP Temp Temp src Pulse Resp SpO2  07/13/18 0625 136/78 98.7 F (37.1 C) Oral 87 (!) 93 95 %    A history and physical examination was performed in my office.  The patient was reexamined.  There have been no changes to this history and physical examination.  Tyson Babinski, DPM

## 2018-07-14 ENCOUNTER — Encounter (HOSPITAL_COMMUNITY): Payer: Self-pay | Admitting: Podiatry

## 2018-07-19 DIAGNOSIS — F5221 Male erectile disorder: Secondary | ICD-10-CM | POA: Diagnosis not present

## 2018-08-02 DIAGNOSIS — E291 Testicular hypofunction: Secondary | ICD-10-CM | POA: Diagnosis not present

## 2018-08-15 DIAGNOSIS — Z9889 Other specified postprocedural states: Secondary | ICD-10-CM | POA: Diagnosis not present

## 2018-08-15 DIAGNOSIS — T8130XA Disruption of wound, unspecified, initial encounter: Secondary | ICD-10-CM | POA: Diagnosis not present

## 2018-08-30 DIAGNOSIS — E291 Testicular hypofunction: Secondary | ICD-10-CM | POA: Diagnosis not present

## 2018-09-26 DIAGNOSIS — T8130XA Disruption of wound, unspecified, initial encounter: Secondary | ICD-10-CM | POA: Diagnosis not present

## 2018-09-26 DIAGNOSIS — Z9889 Other specified postprocedural states: Secondary | ICD-10-CM | POA: Diagnosis not present

## 2018-10-06 DIAGNOSIS — E291 Testicular hypofunction: Secondary | ICD-10-CM | POA: Diagnosis not present

## 2018-10-06 DIAGNOSIS — Z23 Encounter for immunization: Secondary | ICD-10-CM | POA: Diagnosis not present

## 2018-10-10 DIAGNOSIS — R002 Palpitations: Secondary | ICD-10-CM | POA: Diagnosis not present

## 2018-10-10 DIAGNOSIS — E782 Mixed hyperlipidemia: Secondary | ICD-10-CM | POA: Diagnosis not present

## 2018-10-10 DIAGNOSIS — I10 Essential (primary) hypertension: Secondary | ICD-10-CM | POA: Diagnosis not present

## 2018-10-10 DIAGNOSIS — N183 Chronic kidney disease, stage 3 (moderate): Secondary | ICD-10-CM | POA: Diagnosis not present

## 2018-10-10 DIAGNOSIS — E291 Testicular hypofunction: Secondary | ICD-10-CM | POA: Diagnosis not present

## 2018-10-12 DIAGNOSIS — I10 Essential (primary) hypertension: Secondary | ICD-10-CM | POA: Diagnosis not present

## 2018-10-12 DIAGNOSIS — Z23 Encounter for immunization: Secondary | ICD-10-CM | POA: Diagnosis not present

## 2018-10-12 DIAGNOSIS — Z0001 Encounter for general adult medical examination with abnormal findings: Secondary | ICD-10-CM | POA: Diagnosis not present

## 2018-10-12 DIAGNOSIS — Z6829 Body mass index (BMI) 29.0-29.9, adult: Secondary | ICD-10-CM | POA: Diagnosis not present

## 2018-10-28 DIAGNOSIS — E291 Testicular hypofunction: Secondary | ICD-10-CM | POA: Diagnosis not present

## 2018-11-01 ENCOUNTER — Other Ambulatory Visit: Payer: Self-pay

## 2018-11-01 ENCOUNTER — Encounter (HOSPITAL_COMMUNITY): Payer: Self-pay | Admitting: Emergency Medicine

## 2018-11-01 ENCOUNTER — Emergency Department (HOSPITAL_COMMUNITY): Payer: Medicare Other

## 2018-11-01 ENCOUNTER — Emergency Department (HOSPITAL_COMMUNITY)
Admission: EM | Admit: 2018-11-01 | Discharge: 2018-11-01 | Disposition: A | Discharge status: Home / Self Care | Payer: Medicare Other | Attending: Emergency Medicine | Admitting: Emergency Medicine

## 2018-11-01 DIAGNOSIS — F329 Major depressive disorder, single episode, unspecified: Secondary | ICD-10-CM | POA: Insufficient documentation

## 2018-11-01 DIAGNOSIS — S8991XA Unspecified injury of right lower leg, initial encounter: Secondary | ICD-10-CM | POA: Diagnosis not present

## 2018-11-01 DIAGNOSIS — Z79899 Other long term (current) drug therapy: Secondary | ICD-10-CM | POA: Diagnosis not present

## 2018-11-01 DIAGNOSIS — M25561 Pain in right knee: Secondary | ICD-10-CM | POA: Insufficient documentation

## 2018-11-01 DIAGNOSIS — F419 Anxiety disorder, unspecified: Secondary | ICD-10-CM | POA: Insufficient documentation

## 2018-11-01 NOTE — ED Provider Notes (Signed)
[facility] EMERGENCY DEPARTMENT Provider Note   CSN: [id number] Arrival date & time: 11/01/18  1219     History   Chief Complaint Chief Complaint  Patient presents with  . Knee Pain    HPI Edward Jacobson is a 62 y.o. male.  62 y/o male with a PMH of anxiety, depression presents to the ED with a chief complaint of right knee pain since this morning.  Patient reports he was putting on his shoes when he felt this sharp pain on his right knee, he immediately had to sit down as he felt like he could not put any weight on his knees.  Scribes the pain as sharp worse with walking but better with rest.  Has not tried any therapy for pain relief.  He denies any previous history of gout, fever, trauma, shortness of breath.     Past Medical History:  Diagnosis Date  . Anxiety   . Depression   . Renal disorder    kidneys stones    There are no active problems to display for this patient.   Past Surgical History:  Procedure Laterality Date  . FOREIGN BODY REMOVAL Left 07/13/2018   Procedure: REMOVAL FOREIGN BODY LEFT FOOT;  Surgeon: Tyson Babinski, DPM;  Location: [facility];  Service: Podiatry;  Laterality: Left;  . SHOULDER SURGERY Left         Home Medications    Prior to Admission medications   Medication Sig Start Date End Date Taking? Authorizing Provider  buPROPion (WELLBUTRIN XL) 300 MG 24 hr tablet Take 300 mg by mouth daily.    [provider]  CREATINE PO Take 5 g by mouth daily.    [provider]  FLUoxetine (PROZAC) 20 MG capsule Take 20 mg by mouth 3 (three) times daily.     [provider]  GINSENG PO Take 300 mg by mouth daily.     [provider]  L-ARGININE PO Take 3,000 mg by mouth daily.     [provider]  Multiple Vitamin (MULTIVITAMIN WITH MINERALS) TABS tablet Take 1 tablet by mouth daily.    [provider]  ondansetron (ZOFRAN ODT) 4 MG disintegrating tablet Take 1 tablet (4 mg total) by mouth  every 8 (eight) hours as needed for nausea. Patient not taking: Reported on 07/08/2018 04/24/15   Tanna Furry, MD  oxyCODONE-acetaminophen (PERCOCET/ROXICET) 5-325 MG per tablet Take 2 tablets by mouth every 4 (four) hours as needed. Patient not taking: Reported on 07/08/2018 04/24/15   Tanna Furry, MD  PROTEIN PO Take 20 g by mouth daily.    [provider]  tamsulosin (FLOMAX) 0.4 MG CAPS capsule Take 1 capsule (0.4 mg total) by mouth daily. Patient not taking: Reported on 07/08/2018 04/24/15   Tanna Furry, MD    Family History History reviewed. No pertinent family history.  Social History Social History   Tobacco Use  . Smoking status: Never Smoker  . Smokeless tobacco: Never Used  Substance Use Topics  . Alcohol use: No  . Drug use: No     Allergies   Patient has no known allergies.   Review of Systems Review of Systems  Constitutional: Negative for fever.  Musculoskeletal: Positive for arthralgias and myalgias.     Physical Exam Updated Vital Signs BP (!) 143/88 (BP Location: Right Arm)   Pulse 87   Temp 98.8 F (37.1 C) (Tympanic)   Resp 16   Ht 5' 10.5" (1.791 m)   Wt 92.5 kg  SpO2 97%   BMI 28.86 kg/m   Physical Exam  Constitutional: He is oriented to person, place, and time. He appears well-developed and well-nourished.  HENT:  Head: Normocephalic and atraumatic.  Mouth/Throat: Oropharynx is clear and moist.  Eyes: Pupils are equal, round, and reactive to light. No scleral icterus.  Neck: Normal range of motion.  Cardiovascular: Normal heart sounds.  Pulmonary/Chest: Effort normal and breath sounds normal. He has no wheezes. He exhibits no tenderness.  Abdominal: Soft. Bowel sounds are normal. He exhibits no distension. There is no tenderness.  Musculoskeletal: He exhibits no deformity.       Right knee: He exhibits no swelling, no effusion, no erythema, normal alignment and no LCL laxity. Tenderness found. Medial joint line tenderness noted.    Pulses present, capillary refill is intact, neurologically intact.  Patient does have pain with extension at the knee but not flexion.  Pain is also worse with plantarflexion but is able to tolerate dorsiflexion.  No erythema, edema, effusion noted.  Neurological: He is alert and oriented to person, place, and time.  Skin: Skin is warm and dry.  Nursing note and vitals reviewed.    ED Treatments / Results  Labs (all labs ordered are listed, but only abnormal results are displayed) Labs Reviewed - No data to display  EKG None  Radiology Dg Knee Complete 4 Views Right  Result Date: 11/01/2018 CLINICAL DATA:  Pain after twisting injury. EXAM: RIGHT KNEE - COMPLETE 4+ VIEW COMPARISON:  None. FINDINGS: No acute fracture or dislocation. Medial and patellofemoral compartment osteoarthritis with joint space narrowing and subchondral sclerosis. Relatively mild. No joint effusion. IMPRESSION: No acute osseous abnormality. Electronically Signed   By: Abigail Miyamoto [name]   On: 11/01/2018 13:18    Procedures Procedures (including critical care time)  Medications Ordered in ED Medications - No data to display   Initial Impression / Assessment and Plan / ED Course  I have reviewed the triage vital signs and the nursing notes.  Pertinent labs & imaging results that were available during my care of the patient were reviewed by me and considered in my medical decision making (see chart for details).    Patient presents with right knee pain which began this morning.  He has pain on straightening of his leg but not flexion.  DG right knee showed medial impact patellofemoral compartment osteoarthritis with joint space narrowing and subchondral sclerosis.  No acute osseous abnormality.  Patient is unable to take NSAIDs as he reports a history of kidney disease.  I have personally reviewed patient's records and see his last creatinine level was charted at 1.59, unable to provide patient with NSAIDs at  this time.  I have advised patient that he needs to apply ice, heat to his knee along with follow-up with his primary care in order to obtain a referral for an orthopedist. Patient is afebrile, vitals are stable. Return precautions provided.   Final Clinical Impressions(s) / ED Diagnoses   Final diagnoses:  Acute pain of right knee    ED Discharge Orders    None       Janeece Fitting, PA-C 11/01/18 1344    Elnora Morrison, MD 11/02/18 [phone]

## 2018-11-01 NOTE — ED Triage Notes (Signed)
Pt reports he was kneeling to put his grandchild's shoe on and when he stood up his knee gave out. Denies hearing a pop. Denies past injury.

## 2018-11-01 NOTE — Discharge Instructions (Addendum)
You may take tylenol for your pain along with apply heat/ice to the area.  Please follow-up with your primary care physician in order to obtain a referral to an orthopedist who can further evaluate your right knee pain.

## 2018-11-11 DIAGNOSIS — E291 Testicular hypofunction: Secondary | ICD-10-CM | POA: Diagnosis not present

## 2018-12-05 DIAGNOSIS — E291 Testicular hypofunction: Secondary | ICD-10-CM | POA: Diagnosis not present

## 2018-12-19 DIAGNOSIS — N529 Male erectile dysfunction, unspecified: Secondary | ICD-10-CM | POA: Diagnosis not present

## 2019-01-31 DIAGNOSIS — E291 Testicular hypofunction: Secondary | ICD-10-CM | POA: Diagnosis not present

## 2019-02-07 DIAGNOSIS — I10 Essential (primary) hypertension: Secondary | ICD-10-CM | POA: Diagnosis not present

## 2019-02-07 DIAGNOSIS — N183 Chronic kidney disease, stage 3 (moderate): Secondary | ICD-10-CM | POA: Diagnosis not present

## 2019-02-07 DIAGNOSIS — E782 Mixed hyperlipidemia: Secondary | ICD-10-CM | POA: Diagnosis not present

## 2019-02-07 DIAGNOSIS — R5383 Other fatigue: Secondary | ICD-10-CM | POA: Diagnosis not present

## 2019-02-07 DIAGNOSIS — E291 Testicular hypofunction: Secondary | ICD-10-CM | POA: Diagnosis not present

## 2019-02-07 DIAGNOSIS — R002 Palpitations: Secondary | ICD-10-CM | POA: Diagnosis not present

## 2019-02-14 DIAGNOSIS — F900 Attention-deficit hyperactivity disorder, predominantly inattentive type: Secondary | ICD-10-CM | POA: Diagnosis not present

## 2019-02-14 DIAGNOSIS — N183 Chronic kidney disease, stage 3 (moderate): Secondary | ICD-10-CM | POA: Diagnosis not present

## 2019-02-14 DIAGNOSIS — Z6829 Body mass index (BMI) 29.0-29.9, adult: Secondary | ICD-10-CM | POA: Diagnosis not present

## 2019-02-14 DIAGNOSIS — I10 Essential (primary) hypertension: Secondary | ICD-10-CM | POA: Diagnosis not present

## 2019-02-14 DIAGNOSIS — F5221 Male erectile disorder: Secondary | ICD-10-CM | POA: Diagnosis not present

## 2019-02-14 DIAGNOSIS — F331 Major depressive disorder, recurrent, moderate: Secondary | ICD-10-CM | POA: Diagnosis not present

## 2019-02-14 DIAGNOSIS — E782 Mixed hyperlipidemia: Secondary | ICD-10-CM | POA: Diagnosis not present

## 2019-02-28 DIAGNOSIS — E291 Testicular hypofunction: Secondary | ICD-10-CM | POA: Diagnosis not present

## 2019-03-09 DIAGNOSIS — N183 Chronic kidney disease, stage 3 (moderate): Secondary | ICD-10-CM | POA: Diagnosis not present

## 2019-03-14 DIAGNOSIS — F5221 Male erectile disorder: Secondary | ICD-10-CM | POA: Diagnosis not present

## 2019-03-14 DIAGNOSIS — E291 Testicular hypofunction: Secondary | ICD-10-CM | POA: Diagnosis not present

## 2019-03-31 DIAGNOSIS — E291 Testicular hypofunction: Secondary | ICD-10-CM | POA: Diagnosis not present

## 2019-04-14 DIAGNOSIS — E291 Testicular hypofunction: Secondary | ICD-10-CM | POA: Diagnosis not present

## 2019-05-05 DIAGNOSIS — E291 Testicular hypofunction: Secondary | ICD-10-CM | POA: Diagnosis not present

## 2019-05-10 DIAGNOSIS — M7551 Bursitis of right shoulder: Secondary | ICD-10-CM | POA: Diagnosis not present

## 2019-05-10 DIAGNOSIS — Z6829 Body mass index (BMI) 29.0-29.9, adult: Secondary | ICD-10-CM | POA: Diagnosis not present

## 2019-05-17 DIAGNOSIS — Z6829 Body mass index (BMI) 29.0-29.9, adult: Secondary | ICD-10-CM | POA: Diagnosis not present

## 2019-05-17 DIAGNOSIS — I10 Essential (primary) hypertension: Secondary | ICD-10-CM | POA: Diagnosis not present

## 2019-05-17 DIAGNOSIS — M7551 Bursitis of right shoulder: Secondary | ICD-10-CM | POA: Diagnosis not present

## 2019-05-19 DIAGNOSIS — E291 Testicular hypofunction: Secondary | ICD-10-CM | POA: Diagnosis not present

## 2019-06-02 DIAGNOSIS — E291 Testicular hypofunction: Secondary | ICD-10-CM | POA: Diagnosis not present

## 2019-06-08 IMAGING — DX DG FOOT COMPLETE 3+V*L*
3 series · 3 of 3 positions shown · non-contrast
Comparison: 06/21/2018

CLINICAL DATA: Stepped on a piece of glass 1 month ago with LEFT
foot, preoperative evaluation for foreign body removal

EXAM:
LEFT FOOT - COMPLETE 3+ VIEW

[foot ap]
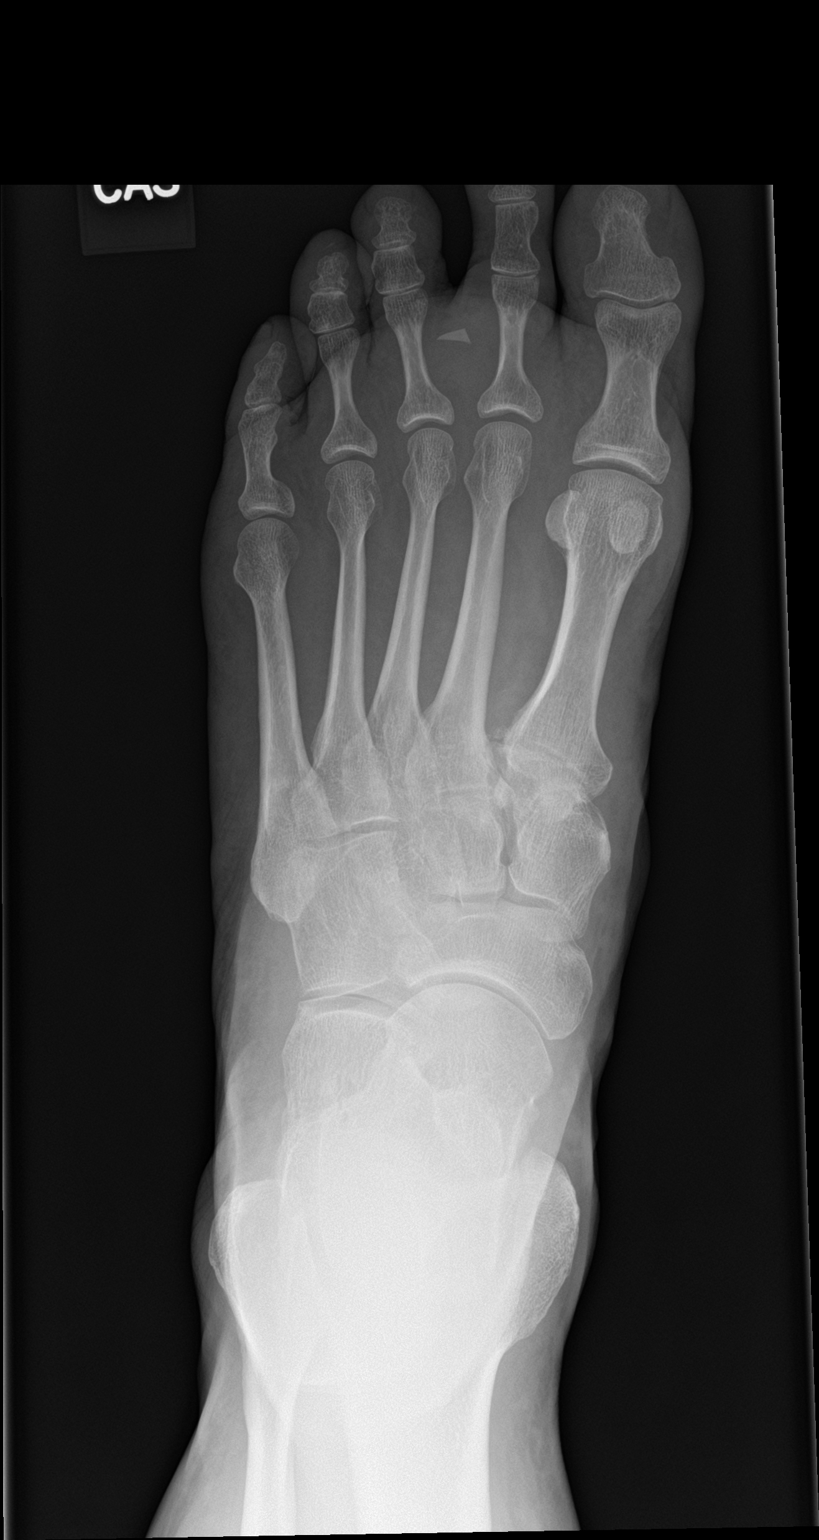

[foot obl]
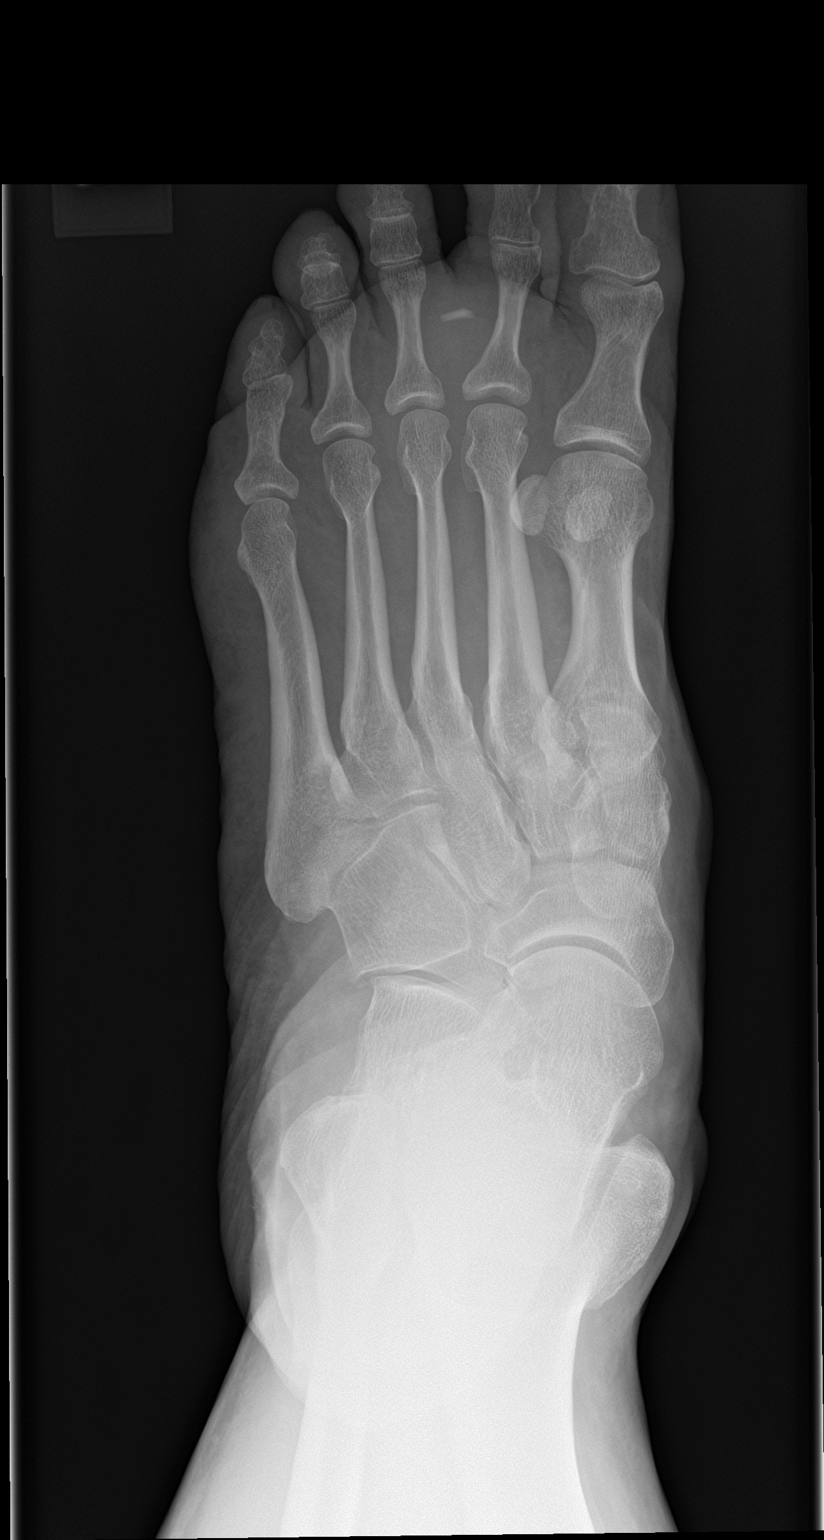

[foot lat]
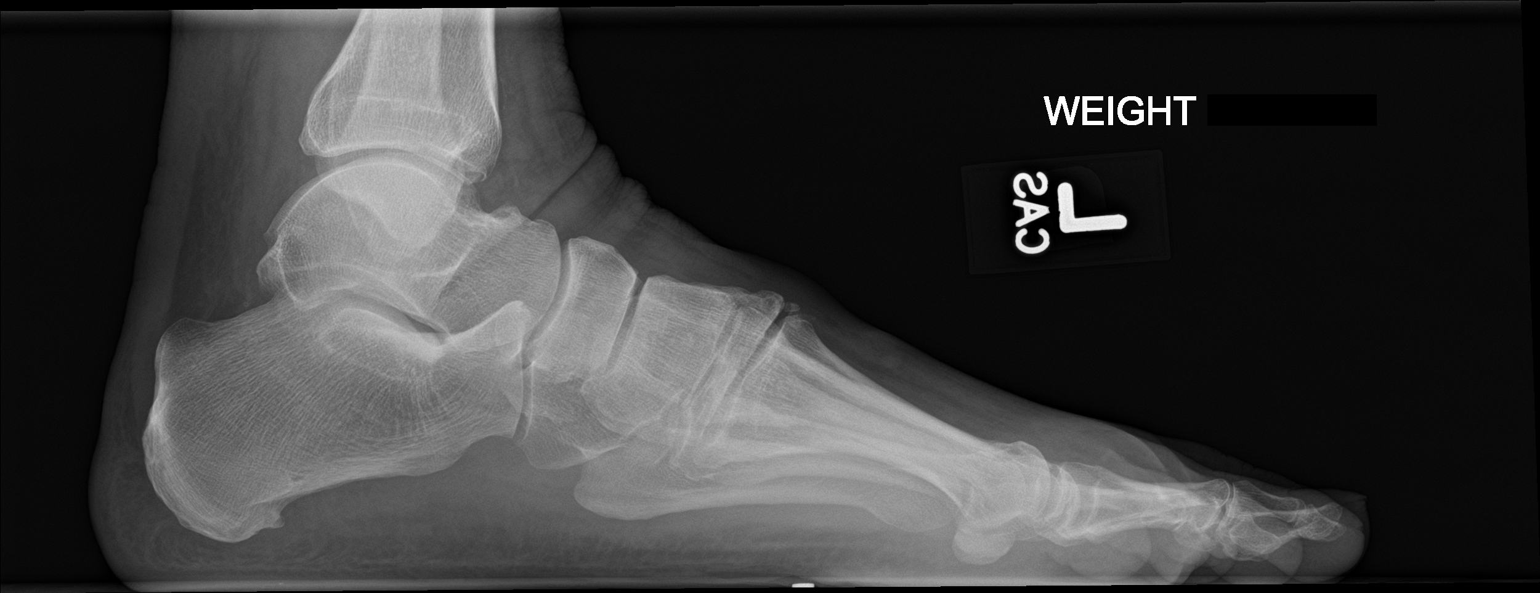

[3 of 3 positions shown; findings below may reference images not displayed]

FINDINGS: Osseous mineralization normal.

Joint spaces preserved.

Triangular foreign body 7 x 3 mm at ball of foot between the
proximal phalanges of the second and third toes compatible with a
glass fragment.

This is seen on the [facility] and oblique views but not visualized on the
weight-bearing lateral view.

No additional radiopaque foreign bodies.

No fracture, dislocation, or bone destruction.
IMPRESSION: 7 x 3 mm triangular radiopaque foreign body between the proximal
phalanges of the second and third toes consistent with glass
fragment.

## 2019-06-09 DIAGNOSIS — E782 Mixed hyperlipidemia: Secondary | ICD-10-CM | POA: Diagnosis not present

## 2019-06-09 DIAGNOSIS — N183 Chronic kidney disease, stage 3 (moderate): Secondary | ICD-10-CM | POA: Diagnosis not present

## 2019-06-09 DIAGNOSIS — I10 Essential (primary) hypertension: Secondary | ICD-10-CM | POA: Diagnosis not present

## 2019-06-09 DIAGNOSIS — F331 Major depressive disorder, recurrent, moderate: Secondary | ICD-10-CM | POA: Diagnosis not present

## 2019-06-09 DIAGNOSIS — F900 Attention-deficit hyperactivity disorder, predominantly inattentive type: Secondary | ICD-10-CM | POA: Diagnosis not present

## 2019-06-09 DIAGNOSIS — R002 Palpitations: Secondary | ICD-10-CM | POA: Diagnosis not present

## 2019-06-09 DIAGNOSIS — R5383 Other fatigue: Secondary | ICD-10-CM | POA: Diagnosis not present

## 2019-06-14 DIAGNOSIS — F5221 Male erectile disorder: Secondary | ICD-10-CM | POA: Diagnosis not present

## 2019-06-14 DIAGNOSIS — F331 Major depressive disorder, recurrent, moderate: Secondary | ICD-10-CM | POA: Diagnosis not present

## 2019-06-14 DIAGNOSIS — G252 Other specified forms of tremor: Secondary | ICD-10-CM | POA: Diagnosis not present

## 2019-06-14 DIAGNOSIS — F900 Attention-deficit hyperactivity disorder, predominantly inattentive type: Secondary | ICD-10-CM | POA: Diagnosis not present

## 2019-06-14 DIAGNOSIS — I10 Essential (primary) hypertension: Secondary | ICD-10-CM | POA: Diagnosis not present

## 2019-06-14 DIAGNOSIS — E782 Mixed hyperlipidemia: Secondary | ICD-10-CM | POA: Diagnosis not present

## 2019-06-14 DIAGNOSIS — Z6828 Body mass index (BMI) 28.0-28.9, adult: Secondary | ICD-10-CM | POA: Diagnosis not present

## 2019-06-14 DIAGNOSIS — N183 Chronic kidney disease, stage 3 (moderate): Secondary | ICD-10-CM | POA: Diagnosis not present

## 2019-06-16 DIAGNOSIS — E291 Testicular hypofunction: Secondary | ICD-10-CM | POA: Diagnosis not present

## 2019-06-30 DIAGNOSIS — E291 Testicular hypofunction: Secondary | ICD-10-CM | POA: Diagnosis not present

## 2019-07-14 DIAGNOSIS — E291 Testicular hypofunction: Secondary | ICD-10-CM | POA: Diagnosis not present

## 2019-07-28 DIAGNOSIS — E291 Testicular hypofunction: Secondary | ICD-10-CM | POA: Diagnosis not present

## 2019-08-14 DIAGNOSIS — E291 Testicular hypofunction: Secondary | ICD-10-CM | POA: Diagnosis not present

## 2019-08-28 DIAGNOSIS — E291 Testicular hypofunction: Secondary | ICD-10-CM | POA: Diagnosis not present

## 2019-09-12 DIAGNOSIS — E291 Testicular hypofunction: Secondary | ICD-10-CM | POA: Diagnosis not present

## 2019-09-27 DIAGNOSIS — E291 Testicular hypofunction: Secondary | ICD-10-CM | POA: Diagnosis not present

## 2019-10-12 DIAGNOSIS — R5383 Other fatigue: Secondary | ICD-10-CM | POA: Diagnosis not present

## 2019-10-12 DIAGNOSIS — R972 Elevated prostate specific antigen [PSA]: Secondary | ICD-10-CM | POA: Diagnosis not present

## 2019-10-12 DIAGNOSIS — E782 Mixed hyperlipidemia: Secondary | ICD-10-CM | POA: Diagnosis not present

## 2019-10-12 DIAGNOSIS — F5221 Male erectile disorder: Secondary | ICD-10-CM | POA: Diagnosis not present

## 2019-10-12 DIAGNOSIS — I10 Essential (primary) hypertension: Secondary | ICD-10-CM | POA: Diagnosis not present

## 2019-10-12 DIAGNOSIS — E291 Testicular hypofunction: Secondary | ICD-10-CM | POA: Diagnosis not present

## 2019-10-16 DIAGNOSIS — F900 Attention-deficit hyperactivity disorder, predominantly inattentive type: Secondary | ICD-10-CM | POA: Diagnosis not present

## 2019-10-16 DIAGNOSIS — F331 Major depressive disorder, recurrent, moderate: Secondary | ICD-10-CM | POA: Diagnosis not present

## 2019-10-16 DIAGNOSIS — I10 Essential (primary) hypertension: Secondary | ICD-10-CM | POA: Diagnosis not present

## 2019-10-16 DIAGNOSIS — N189 Chronic kidney disease, unspecified: Secondary | ICD-10-CM | POA: Diagnosis not present

## 2019-10-16 DIAGNOSIS — G252 Other specified forms of tremor: Secondary | ICD-10-CM | POA: Diagnosis not present

## 2019-10-16 DIAGNOSIS — Z0001 Encounter for general adult medical examination with abnormal findings: Secondary | ICD-10-CM | POA: Diagnosis not present

## 2019-10-16 DIAGNOSIS — Z23 Encounter for immunization: Secondary | ICD-10-CM | POA: Diagnosis not present

## 2019-10-16 DIAGNOSIS — Z6830 Body mass index (BMI) 30.0-30.9, adult: Secondary | ICD-10-CM | POA: Diagnosis not present

## 2019-10-31 DIAGNOSIS — Z6831 Body mass index (BMI) 31.0-31.9, adult: Secondary | ICD-10-CM | POA: Diagnosis not present

## 2019-10-31 DIAGNOSIS — E291 Testicular hypofunction: Secondary | ICD-10-CM | POA: Diagnosis not present

## 2020-01-18 DIAGNOSIS — E782 Mixed hyperlipidemia: Secondary | ICD-10-CM | POA: Diagnosis not present

## 2020-01-18 DIAGNOSIS — F900 Attention-deficit hyperactivity disorder, predominantly inattentive type: Secondary | ICD-10-CM | POA: Diagnosis not present

## 2020-01-18 DIAGNOSIS — Z6830 Body mass index (BMI) 30.0-30.9, adult: Secondary | ICD-10-CM | POA: Diagnosis not present

## 2020-01-18 DIAGNOSIS — I10 Essential (primary) hypertension: Secondary | ICD-10-CM | POA: Diagnosis not present

## 2020-01-18 DIAGNOSIS — F5221 Male erectile disorder: Secondary | ICD-10-CM | POA: Diagnosis not present

## 2020-01-18 DIAGNOSIS — F331 Major depressive disorder, recurrent, moderate: Secondary | ICD-10-CM | POA: Diagnosis not present

## 2020-01-18 DIAGNOSIS — R4582 Worries: Secondary | ICD-10-CM | POA: Diagnosis not present

## 2020-02-13 DIAGNOSIS — E291 Testicular hypofunction: Secondary | ICD-10-CM | POA: Diagnosis not present

## 2020-04-15 DIAGNOSIS — E782 Mixed hyperlipidemia: Secondary | ICD-10-CM | POA: Diagnosis not present

## 2020-04-15 DIAGNOSIS — I10 Essential (primary) hypertension: Secondary | ICD-10-CM | POA: Diagnosis not present

## 2020-04-15 DIAGNOSIS — N183 Chronic kidney disease, stage 3 unspecified: Secondary | ICD-10-CM | POA: Diagnosis not present

## 2020-04-18 DIAGNOSIS — N1831 Chronic kidney disease, stage 3a: Secondary | ICD-10-CM | POA: Diagnosis not present

## 2020-04-18 DIAGNOSIS — F331 Major depressive disorder, recurrent, moderate: Secondary | ICD-10-CM | POA: Diagnosis not present

## 2020-04-18 DIAGNOSIS — E782 Mixed hyperlipidemia: Secondary | ICD-10-CM | POA: Diagnosis not present

## 2020-04-18 DIAGNOSIS — Z23 Encounter for immunization: Secondary | ICD-10-CM | POA: Diagnosis not present

## 2020-04-18 DIAGNOSIS — R4582 Worries: Secondary | ICD-10-CM | POA: Diagnosis not present

## 2020-04-18 DIAGNOSIS — F900 Attention-deficit hyperactivity disorder, predominantly inattentive type: Secondary | ICD-10-CM | POA: Diagnosis not present

## 2020-04-18 DIAGNOSIS — I10 Essential (primary) hypertension: Secondary | ICD-10-CM | POA: Diagnosis not present

## 2020-04-18 DIAGNOSIS — Z6830 Body mass index (BMI) 30.0-30.9, adult: Secondary | ICD-10-CM | POA: Diagnosis not present

## 2020-05-16 DIAGNOSIS — Z23 Encounter for immunization: Secondary | ICD-10-CM | POA: Diagnosis not present

## 2020-07-18 DIAGNOSIS — E291 Testicular hypofunction: Secondary | ICD-10-CM | POA: Diagnosis not present

## 2020-07-29 DIAGNOSIS — R4582 Worries: Secondary | ICD-10-CM | POA: Diagnosis not present

## 2020-07-29 DIAGNOSIS — E782 Mixed hyperlipidemia: Secondary | ICD-10-CM | POA: Diagnosis not present

## 2020-07-29 DIAGNOSIS — F5221 Male erectile disorder: Secondary | ICD-10-CM | POA: Diagnosis not present

## 2020-07-29 DIAGNOSIS — Z6830 Body mass index (BMI) 30.0-30.9, adult: Secondary | ICD-10-CM | POA: Diagnosis not present

## 2020-07-29 DIAGNOSIS — F331 Major depressive disorder, recurrent, moderate: Secondary | ICD-10-CM | POA: Diagnosis not present

## 2020-07-29 DIAGNOSIS — I10 Essential (primary) hypertension: Secondary | ICD-10-CM | POA: Diagnosis not present

## 2020-07-29 DIAGNOSIS — F900 Attention-deficit hyperactivity disorder, predominantly inattentive type: Secondary | ICD-10-CM | POA: Diagnosis not present

## 2020-08-01 DIAGNOSIS — E291 Testicular hypofunction: Secondary | ICD-10-CM | POA: Diagnosis not present

## 2020-08-15 DIAGNOSIS — E291 Testicular hypofunction: Secondary | ICD-10-CM | POA: Diagnosis not present

## 2020-08-29 DIAGNOSIS — E291 Testicular hypofunction: Secondary | ICD-10-CM | POA: Diagnosis not present

## 2020-09-17 DIAGNOSIS — E291 Testicular hypofunction: Secondary | ICD-10-CM | POA: Diagnosis not present

## 2020-10-15 DIAGNOSIS — Z1329 Encounter for screening for other suspected endocrine disorder: Secondary | ICD-10-CM | POA: Diagnosis not present

## 2020-10-15 DIAGNOSIS — I10 Essential (primary) hypertension: Secondary | ICD-10-CM | POA: Diagnosis not present

## 2020-10-15 DIAGNOSIS — E782 Mixed hyperlipidemia: Secondary | ICD-10-CM | POA: Diagnosis not present

## 2020-10-15 DIAGNOSIS — F900 Attention-deficit hyperactivity disorder, predominantly inattentive type: Secondary | ICD-10-CM | POA: Diagnosis not present

## 2020-10-15 DIAGNOSIS — R5383 Other fatigue: Secondary | ICD-10-CM | POA: Diagnosis not present

## 2020-10-15 DIAGNOSIS — N183 Chronic kidney disease, stage 3 unspecified: Secondary | ICD-10-CM | POA: Diagnosis not present

## 2020-10-15 DIAGNOSIS — R972 Elevated prostate specific antigen [PSA]: Secondary | ICD-10-CM | POA: Diagnosis not present

## 2020-10-18 DIAGNOSIS — I10 Essential (primary) hypertension: Secondary | ICD-10-CM | POA: Diagnosis not present

## 2020-10-18 DIAGNOSIS — R4582 Worries: Secondary | ICD-10-CM | POA: Diagnosis not present

## 2020-10-18 DIAGNOSIS — F900 Attention-deficit hyperactivity disorder, predominantly inattentive type: Secondary | ICD-10-CM | POA: Diagnosis not present

## 2020-10-18 DIAGNOSIS — F5221 Male erectile disorder: Secondary | ICD-10-CM | POA: Diagnosis not present

## 2020-10-18 DIAGNOSIS — Z6829 Body mass index (BMI) 29.0-29.9, adult: Secondary | ICD-10-CM | POA: Diagnosis not present

## 2020-10-18 DIAGNOSIS — E782 Mixed hyperlipidemia: Secondary | ICD-10-CM | POA: Diagnosis not present

## 2020-10-18 DIAGNOSIS — N1831 Chronic kidney disease, stage 3a: Secondary | ICD-10-CM | POA: Diagnosis not present

## 2020-10-18 DIAGNOSIS — Z23 Encounter for immunization: Secondary | ICD-10-CM | POA: Diagnosis not present

## 2020-11-08 DIAGNOSIS — E291 Testicular hypofunction: Secondary | ICD-10-CM | POA: Diagnosis not present

## 2020-12-05 DIAGNOSIS — E291 Testicular hypofunction: Secondary | ICD-10-CM | POA: Diagnosis not present

## 2020-12-19 DIAGNOSIS — E291 Testicular hypofunction: Secondary | ICD-10-CM | POA: Diagnosis not present

## 2020-12-26 DIAGNOSIS — Z23 Encounter for immunization: Secondary | ICD-10-CM | POA: Diagnosis not present

## 2021-01-15 DIAGNOSIS — E7849 Other hyperlipidemia: Secondary | ICD-10-CM | POA: Diagnosis not present

## 2021-01-15 DIAGNOSIS — I10 Essential (primary) hypertension: Secondary | ICD-10-CM | POA: Diagnosis not present

## 2021-01-15 DIAGNOSIS — N183 Chronic kidney disease, stage 3 unspecified: Secondary | ICD-10-CM | POA: Diagnosis not present

## 2021-01-15 DIAGNOSIS — E782 Mixed hyperlipidemia: Secondary | ICD-10-CM | POA: Diagnosis not present

## 2021-01-15 DIAGNOSIS — R946 Abnormal results of thyroid function studies: Secondary | ICD-10-CM | POA: Diagnosis not present

## 2021-01-17 DIAGNOSIS — N1831 Chronic kidney disease, stage 3a: Secondary | ICD-10-CM | POA: Diagnosis not present

## 2021-01-17 DIAGNOSIS — I10 Essential (primary) hypertension: Secondary | ICD-10-CM | POA: Diagnosis not present

## 2021-01-17 DIAGNOSIS — F5221 Male erectile disorder: Secondary | ICD-10-CM | POA: Diagnosis not present

## 2021-01-17 DIAGNOSIS — E7849 Other hyperlipidemia: Secondary | ICD-10-CM | POA: Diagnosis not present

## 2021-01-17 DIAGNOSIS — F331 Major depressive disorder, recurrent, moderate: Secondary | ICD-10-CM | POA: Diagnosis not present

## 2021-01-17 DIAGNOSIS — Z6829 Body mass index (BMI) 29.0-29.9, adult: Secondary | ICD-10-CM | POA: Diagnosis not present

## 2021-01-17 DIAGNOSIS — F900 Attention-deficit hyperactivity disorder, predominantly inattentive type: Secondary | ICD-10-CM | POA: Diagnosis not present

## 2021-01-17 DIAGNOSIS — R4582 Worries: Secondary | ICD-10-CM | POA: Diagnosis not present

## 2021-01-31 DIAGNOSIS — E291 Testicular hypofunction: Secondary | ICD-10-CM | POA: Diagnosis not present

## 2021-02-19 DIAGNOSIS — E291 Testicular hypofunction: Secondary | ICD-10-CM | POA: Diagnosis not present

## 2021-03-07 DIAGNOSIS — E291 Testicular hypofunction: Secondary | ICD-10-CM | POA: Diagnosis not present

## 2021-03-21 DIAGNOSIS — E291 Testicular hypofunction: Secondary | ICD-10-CM | POA: Diagnosis not present

## 2021-04-07 DIAGNOSIS — E291 Testicular hypofunction: Secondary | ICD-10-CM | POA: Diagnosis not present

## 2021-04-08 DIAGNOSIS — I10 Essential (primary) hypertension: Secondary | ICD-10-CM | POA: Diagnosis not present

## 2021-04-08 DIAGNOSIS — Z1329 Encounter for screening for other suspected endocrine disorder: Secondary | ICD-10-CM | POA: Diagnosis not present

## 2021-04-08 DIAGNOSIS — E782 Mixed hyperlipidemia: Secondary | ICD-10-CM | POA: Diagnosis not present

## 2021-04-08 DIAGNOSIS — N1831 Chronic kidney disease, stage 3a: Secondary | ICD-10-CM | POA: Diagnosis not present

## 2021-04-08 DIAGNOSIS — E7849 Other hyperlipidemia: Secondary | ICD-10-CM | POA: Diagnosis not present

## 2021-04-16 DIAGNOSIS — F900 Attention-deficit hyperactivity disorder, predominantly inattentive type: Secondary | ICD-10-CM | POA: Diagnosis not present

## 2021-04-16 DIAGNOSIS — N1831 Chronic kidney disease, stage 3a: Secondary | ICD-10-CM | POA: Diagnosis not present

## 2021-04-16 DIAGNOSIS — Z7189 Other specified counseling: Secondary | ICD-10-CM | POA: Diagnosis not present

## 2021-04-16 DIAGNOSIS — F331 Major depressive disorder, recurrent, moderate: Secondary | ICD-10-CM | POA: Diagnosis not present

## 2021-04-16 DIAGNOSIS — F5221 Male erectile disorder: Secondary | ICD-10-CM | POA: Diagnosis not present

## 2021-04-16 DIAGNOSIS — I10 Essential (primary) hypertension: Secondary | ICD-10-CM | POA: Diagnosis not present

## 2021-04-16 DIAGNOSIS — E7849 Other hyperlipidemia: Secondary | ICD-10-CM | POA: Diagnosis not present

## 2021-04-16 DIAGNOSIS — R4582 Worries: Secondary | ICD-10-CM | POA: Diagnosis not present

## 2021-04-21 DIAGNOSIS — E291 Testicular hypofunction: Secondary | ICD-10-CM | POA: Diagnosis not present

## 2021-04-21 DIAGNOSIS — Z23 Encounter for immunization: Secondary | ICD-10-CM | POA: Diagnosis not present

## 2021-05-05 DIAGNOSIS — E291 Testicular hypofunction: Secondary | ICD-10-CM | POA: Diagnosis not present

## 2021-05-19 DIAGNOSIS — E291 Testicular hypofunction: Secondary | ICD-10-CM | POA: Diagnosis not present

## 2021-06-02 DIAGNOSIS — E291 Testicular hypofunction: Secondary | ICD-10-CM | POA: Diagnosis not present

## 2021-06-16 DIAGNOSIS — E291 Testicular hypofunction: Secondary | ICD-10-CM | POA: Diagnosis not present

## 2021-07-08 DIAGNOSIS — E291 Testicular hypofunction: Secondary | ICD-10-CM | POA: Diagnosis not present

## 2021-07-17 DIAGNOSIS — R5383 Other fatigue: Secondary | ICD-10-CM | POA: Diagnosis not present

## 2021-07-17 DIAGNOSIS — E7849 Other hyperlipidemia: Secondary | ICD-10-CM | POA: Diagnosis not present

## 2021-07-17 DIAGNOSIS — N1831 Chronic kidney disease, stage 3a: Secondary | ICD-10-CM | POA: Diagnosis not present

## 2021-07-17 DIAGNOSIS — I10 Essential (primary) hypertension: Secondary | ICD-10-CM | POA: Diagnosis not present

## 2021-07-17 DIAGNOSIS — E782 Mixed hyperlipidemia: Secondary | ICD-10-CM | POA: Diagnosis not present

## 2021-07-17 DIAGNOSIS — Z1329 Encounter for screening for other suspected endocrine disorder: Secondary | ICD-10-CM | POA: Diagnosis not present

## 2021-07-22 DIAGNOSIS — N1831 Chronic kidney disease, stage 3a: Secondary | ICD-10-CM | POA: Diagnosis not present

## 2021-07-22 DIAGNOSIS — E7849 Other hyperlipidemia: Secondary | ICD-10-CM | POA: Diagnosis not present

## 2021-07-22 DIAGNOSIS — Z23 Encounter for immunization: Secondary | ICD-10-CM | POA: Diagnosis not present

## 2021-07-22 DIAGNOSIS — F5221 Male erectile disorder: Secondary | ICD-10-CM | POA: Diagnosis not present

## 2021-07-22 DIAGNOSIS — R4582 Worries: Secondary | ICD-10-CM | POA: Diagnosis not present

## 2021-07-22 DIAGNOSIS — F331 Major depressive disorder, recurrent, moderate: Secondary | ICD-10-CM | POA: Diagnosis not present

## 2021-07-22 DIAGNOSIS — I10 Essential (primary) hypertension: Secondary | ICD-10-CM | POA: Diagnosis not present

## 2021-07-22 DIAGNOSIS — F900 Attention-deficit hyperactivity disorder, predominantly inattentive type: Secondary | ICD-10-CM | POA: Diagnosis not present

## 2021-08-08 DIAGNOSIS — E291 Testicular hypofunction: Secondary | ICD-10-CM | POA: Diagnosis not present

## 2021-08-11 DIAGNOSIS — D2272 Melanocytic nevi of left lower limb, including hip: Secondary | ICD-10-CM | POA: Diagnosis not present

## 2021-08-11 DIAGNOSIS — X32XXXA Exposure to sunlight, initial encounter: Secondary | ICD-10-CM | POA: Diagnosis not present

## 2021-08-11 DIAGNOSIS — C44612 Basal cell carcinoma of skin of right upper limb, including shoulder: Secondary | ICD-10-CM | POA: Diagnosis not present

## 2021-08-11 DIAGNOSIS — D225 Melanocytic nevi of trunk: Secondary | ICD-10-CM | POA: Diagnosis not present

## 2021-08-11 DIAGNOSIS — C44619 Basal cell carcinoma of skin of left upper limb, including shoulder: Secondary | ICD-10-CM | POA: Diagnosis not present

## 2021-08-11 DIAGNOSIS — L821 Other seborrheic keratosis: Secondary | ICD-10-CM | POA: Diagnosis not present

## 2021-08-11 DIAGNOSIS — D2271 Melanocytic nevi of right lower limb, including hip: Secondary | ICD-10-CM | POA: Diagnosis not present

## 2021-08-11 DIAGNOSIS — D485 Neoplasm of uncertain behavior of skin: Secondary | ICD-10-CM | POA: Diagnosis not present

## 2021-08-11 DIAGNOSIS — D2262 Melanocytic nevi of left upper limb, including shoulder: Secondary | ICD-10-CM | POA: Diagnosis not present

## 2021-08-11 DIAGNOSIS — D045 Carcinoma in situ of skin of trunk: Secondary | ICD-10-CM | POA: Diagnosis not present

## 2021-08-11 DIAGNOSIS — D0462 Carcinoma in situ of skin of left upper limb, including shoulder: Secondary | ICD-10-CM | POA: Diagnosis not present

## 2021-08-11 DIAGNOSIS — D2261 Melanocytic nevi of right upper limb, including shoulder: Secondary | ICD-10-CM | POA: Diagnosis not present

## 2021-08-11 DIAGNOSIS — L570 Actinic keratosis: Secondary | ICD-10-CM | POA: Diagnosis not present

## 2021-08-22 DIAGNOSIS — E291 Testicular hypofunction: Secondary | ICD-10-CM | POA: Diagnosis not present

## 2021-09-05 DIAGNOSIS — E291 Testicular hypofunction: Secondary | ICD-10-CM | POA: Diagnosis not present

## 2021-09-09 DIAGNOSIS — C44612 Basal cell carcinoma of skin of right upper limb, including shoulder: Secondary | ICD-10-CM | POA: Diagnosis not present

## 2021-09-09 DIAGNOSIS — C44712 Basal cell carcinoma of skin of right lower limb, including hip: Secondary | ICD-10-CM | POA: Diagnosis not present

## 2021-09-23 DIAGNOSIS — X32XXXA Exposure to sunlight, initial encounter: Secondary | ICD-10-CM | POA: Diagnosis not present

## 2021-09-23 DIAGNOSIS — D0462 Carcinoma in situ of skin of left upper limb, including shoulder: Secondary | ICD-10-CM | POA: Diagnosis not present

## 2021-09-23 DIAGNOSIS — D045 Carcinoma in situ of skin of trunk: Secondary | ICD-10-CM | POA: Diagnosis not present

## 2021-09-23 DIAGNOSIS — C44619 Basal cell carcinoma of skin of left upper limb, including shoulder: Secondary | ICD-10-CM | POA: Diagnosis not present

## 2021-09-23 DIAGNOSIS — L570 Actinic keratosis: Secondary | ICD-10-CM | POA: Diagnosis not present

## 2021-09-26 DIAGNOSIS — E291 Testicular hypofunction: Secondary | ICD-10-CM | POA: Diagnosis not present

## 2021-10-10 DIAGNOSIS — E291 Testicular hypofunction: Secondary | ICD-10-CM | POA: Diagnosis not present

## 2021-10-17 DIAGNOSIS — R5383 Other fatigue: Secondary | ICD-10-CM | POA: Diagnosis not present

## 2021-10-17 DIAGNOSIS — N183 Chronic kidney disease, stage 3 unspecified: Secondary | ICD-10-CM | POA: Diagnosis not present

## 2021-10-17 DIAGNOSIS — Z125 Encounter for screening for malignant neoplasm of prostate: Secondary | ICD-10-CM | POA: Diagnosis not present

## 2021-10-17 DIAGNOSIS — I10 Essential (primary) hypertension: Secondary | ICD-10-CM | POA: Diagnosis not present

## 2021-10-17 DIAGNOSIS — E782 Mixed hyperlipidemia: Secondary | ICD-10-CM | POA: Diagnosis not present

## 2021-10-17 DIAGNOSIS — Z1329 Encounter for screening for other suspected endocrine disorder: Secondary | ICD-10-CM | POA: Diagnosis not present

## 2021-10-17 DIAGNOSIS — E7849 Other hyperlipidemia: Secondary | ICD-10-CM | POA: Diagnosis not present

## 2021-10-22 DIAGNOSIS — F5221 Male erectile disorder: Secondary | ICD-10-CM | POA: Diagnosis not present

## 2021-10-22 DIAGNOSIS — Z23 Encounter for immunization: Secondary | ICD-10-CM | POA: Diagnosis not present

## 2021-10-22 DIAGNOSIS — Z0001 Encounter for general adult medical examination with abnormal findings: Secondary | ICD-10-CM | POA: Diagnosis not present

## 2021-10-22 DIAGNOSIS — I10 Essential (primary) hypertension: Secondary | ICD-10-CM | POA: Diagnosis not present

## 2021-10-22 DIAGNOSIS — F900 Attention-deficit hyperactivity disorder, predominantly inattentive type: Secondary | ICD-10-CM | POA: Diagnosis not present

## 2021-10-22 DIAGNOSIS — N1831 Chronic kidney disease, stage 3a: Secondary | ICD-10-CM | POA: Diagnosis not present

## 2021-10-22 DIAGNOSIS — F331 Major depressive disorder, recurrent, moderate: Secondary | ICD-10-CM | POA: Diagnosis not present

## 2021-10-22 DIAGNOSIS — R4582 Worries: Secondary | ICD-10-CM | POA: Diagnosis not present

## 2021-10-24 DIAGNOSIS — E291 Testicular hypofunction: Secondary | ICD-10-CM | POA: Diagnosis not present

## 2021-11-14 DIAGNOSIS — E291 Testicular hypofunction: Secondary | ICD-10-CM | POA: Diagnosis not present

## 2021-12-18 DIAGNOSIS — K219 Gastro-esophageal reflux disease without esophagitis: Secondary | ICD-10-CM | POA: Diagnosis not present

## 2021-12-18 DIAGNOSIS — Z86010 Personal history of colonic polyps: Secondary | ICD-10-CM | POA: Diagnosis not present

## 2021-12-18 DIAGNOSIS — K5730 Diverticulosis of large intestine without perforation or abscess without bleeding: Secondary | ICD-10-CM | POA: Diagnosis not present

## 2021-12-18 DIAGNOSIS — Z1211 Encounter for screening for malignant neoplasm of colon: Secondary | ICD-10-CM | POA: Diagnosis not present

## 2021-12-18 DIAGNOSIS — K641 Second degree hemorrhoids: Secondary | ICD-10-CM | POA: Diagnosis not present

## 2021-12-29 DIAGNOSIS — E291 Testicular hypofunction: Secondary | ICD-10-CM | POA: Diagnosis not present

## 2022-01-05 DIAGNOSIS — D2271 Melanocytic nevi of right lower limb, including hip: Secondary | ICD-10-CM | POA: Diagnosis not present

## 2022-01-05 DIAGNOSIS — D0462 Carcinoma in situ of skin of left upper limb, including shoulder: Secondary | ICD-10-CM | POA: Diagnosis not present

## 2022-01-05 DIAGNOSIS — L570 Actinic keratosis: Secondary | ICD-10-CM | POA: Diagnosis not present

## 2022-01-05 DIAGNOSIS — D2272 Melanocytic nevi of left lower limb, including hip: Secondary | ICD-10-CM | POA: Diagnosis not present

## 2022-01-05 DIAGNOSIS — D485 Neoplasm of uncertain behavior of skin: Secondary | ICD-10-CM | POA: Diagnosis not present

## 2022-01-05 DIAGNOSIS — Z85828 Personal history of other malignant neoplasm of skin: Secondary | ICD-10-CM | POA: Diagnosis not present

## 2022-01-05 DIAGNOSIS — D2261 Melanocytic nevi of right upper limb, including shoulder: Secondary | ICD-10-CM | POA: Diagnosis not present

## 2022-01-05 DIAGNOSIS — L821 Other seborrheic keratosis: Secondary | ICD-10-CM | POA: Diagnosis not present

## 2022-01-05 DIAGNOSIS — L218 Other seborrheic dermatitis: Secondary | ICD-10-CM | POA: Diagnosis not present

## 2022-01-12 DIAGNOSIS — E291 Testicular hypofunction: Secondary | ICD-10-CM | POA: Diagnosis not present

## 2022-01-16 DIAGNOSIS — N1831 Chronic kidney disease, stage 3a: Secondary | ICD-10-CM | POA: Diagnosis not present

## 2022-01-16 DIAGNOSIS — E7849 Other hyperlipidemia: Secondary | ICD-10-CM | POA: Diagnosis not present

## 2022-01-16 DIAGNOSIS — R5383 Other fatigue: Secondary | ICD-10-CM | POA: Diagnosis not present

## 2022-01-16 DIAGNOSIS — E782 Mixed hyperlipidemia: Secondary | ICD-10-CM | POA: Diagnosis not present

## 2022-01-19 DIAGNOSIS — E7849 Other hyperlipidemia: Secondary | ICD-10-CM | POA: Diagnosis not present

## 2022-01-19 DIAGNOSIS — R4582 Worries: Secondary | ICD-10-CM | POA: Diagnosis not present

## 2022-01-19 DIAGNOSIS — Z6828 Body mass index (BMI) 28.0-28.9, adult: Secondary | ICD-10-CM | POA: Diagnosis not present

## 2022-01-19 DIAGNOSIS — F331 Major depressive disorder, recurrent, moderate: Secondary | ICD-10-CM | POA: Diagnosis not present

## 2022-01-19 DIAGNOSIS — F900 Attention-deficit hyperactivity disorder, predominantly inattentive type: Secondary | ICD-10-CM | POA: Diagnosis not present

## 2022-01-19 DIAGNOSIS — R69 Illness, unspecified: Secondary | ICD-10-CM | POA: Diagnosis not present

## 2022-01-19 DIAGNOSIS — N1831 Chronic kidney disease, stage 3a: Secondary | ICD-10-CM | POA: Diagnosis not present

## 2022-01-19 DIAGNOSIS — F5221 Male erectile disorder: Secondary | ICD-10-CM | POA: Diagnosis not present

## 2022-01-19 DIAGNOSIS — I10 Essential (primary) hypertension: Secondary | ICD-10-CM | POA: Diagnosis not present

## 2022-01-26 DIAGNOSIS — D0462 Carcinoma in situ of skin of left upper limb, including shoulder: Secondary | ICD-10-CM | POA: Diagnosis not present

## 2022-01-26 DIAGNOSIS — E291 Testicular hypofunction: Secondary | ICD-10-CM | POA: Diagnosis not present

## 2022-02-09 DIAGNOSIS — E291 Testicular hypofunction: Secondary | ICD-10-CM | POA: Diagnosis not present

## 2022-02-23 DIAGNOSIS — E291 Testicular hypofunction: Secondary | ICD-10-CM | POA: Diagnosis not present

## 2022-03-09 DIAGNOSIS — E291 Testicular hypofunction: Secondary | ICD-10-CM | POA: Diagnosis not present

## 2022-03-23 DIAGNOSIS — E291 Testicular hypofunction: Secondary | ICD-10-CM | POA: Diagnosis not present

## 2022-04-06 DIAGNOSIS — E291 Testicular hypofunction: Secondary | ICD-10-CM | POA: Diagnosis not present

## 2022-04-06 DIAGNOSIS — I10 Essential (primary) hypertension: Secondary | ICD-10-CM | POA: Diagnosis not present

## 2022-04-10 DIAGNOSIS — Z1329 Encounter for screening for other suspected endocrine disorder: Secondary | ICD-10-CM | POA: Diagnosis not present

## 2022-04-10 DIAGNOSIS — E782 Mixed hyperlipidemia: Secondary | ICD-10-CM | POA: Diagnosis not present

## 2022-04-10 DIAGNOSIS — Z131 Encounter for screening for diabetes mellitus: Secondary | ICD-10-CM | POA: Diagnosis not present

## 2022-04-10 DIAGNOSIS — R739 Hyperglycemia, unspecified: Secondary | ICD-10-CM | POA: Diagnosis not present

## 2022-04-10 DIAGNOSIS — I10 Essential (primary) hypertension: Secondary | ICD-10-CM | POA: Diagnosis not present

## 2022-04-10 DIAGNOSIS — N183 Chronic kidney disease, stage 3 unspecified: Secondary | ICD-10-CM | POA: Diagnosis not present

## 2022-04-10 DIAGNOSIS — E7849 Other hyperlipidemia: Secondary | ICD-10-CM | POA: Diagnosis not present

## 2022-04-15 DIAGNOSIS — R4582 Worries: Secondary | ICD-10-CM | POA: Diagnosis not present

## 2022-04-15 DIAGNOSIS — N1831 Chronic kidney disease, stage 3a: Secondary | ICD-10-CM | POA: Diagnosis not present

## 2022-04-15 DIAGNOSIS — F5221 Male erectile disorder: Secondary | ICD-10-CM | POA: Diagnosis not present

## 2022-04-15 DIAGNOSIS — F331 Major depressive disorder, recurrent, moderate: Secondary | ICD-10-CM | POA: Diagnosis not present

## 2022-04-15 DIAGNOSIS — E7849 Other hyperlipidemia: Secondary | ICD-10-CM | POA: Diagnosis not present

## 2022-04-15 DIAGNOSIS — R69 Illness, unspecified: Secondary | ICD-10-CM | POA: Diagnosis not present

## 2022-04-15 DIAGNOSIS — F900 Attention-deficit hyperactivity disorder, predominantly inattentive type: Secondary | ICD-10-CM | POA: Diagnosis not present

## 2022-04-15 DIAGNOSIS — I10 Essential (primary) hypertension: Secondary | ICD-10-CM | POA: Diagnosis not present

## 2022-04-15 DIAGNOSIS — Z6829 Body mass index (BMI) 29.0-29.9, adult: Secondary | ICD-10-CM | POA: Diagnosis not present

## 2022-04-20 DIAGNOSIS — E291 Testicular hypofunction: Secondary | ICD-10-CM | POA: Diagnosis not present

## 2022-05-04 DIAGNOSIS — E291 Testicular hypofunction: Secondary | ICD-10-CM | POA: Diagnosis not present

## 2022-05-05 DIAGNOSIS — L821 Other seborrheic keratosis: Secondary | ICD-10-CM | POA: Diagnosis not present

## 2022-05-05 DIAGNOSIS — L218 Other seborrheic dermatitis: Secondary | ICD-10-CM | POA: Diagnosis not present

## 2022-05-05 DIAGNOSIS — Z85828 Personal history of other malignant neoplasm of skin: Secondary | ICD-10-CM | POA: Diagnosis not present

## 2022-05-05 DIAGNOSIS — D2261 Melanocytic nevi of right upper limb, including shoulder: Secondary | ICD-10-CM | POA: Diagnosis not present

## 2022-05-05 DIAGNOSIS — D2272 Melanocytic nevi of left lower limb, including hip: Secondary | ICD-10-CM | POA: Diagnosis not present

## 2022-05-18 DIAGNOSIS — E291 Testicular hypofunction: Secondary | ICD-10-CM | POA: Diagnosis not present

## 2022-06-01 DIAGNOSIS — E291 Testicular hypofunction: Secondary | ICD-10-CM | POA: Diagnosis not present

## 2022-06-18 DIAGNOSIS — E291 Testicular hypofunction: Secondary | ICD-10-CM | POA: Diagnosis not present

## 2022-06-18 DIAGNOSIS — R030 Elevated blood-pressure reading, without diagnosis of hypertension: Secondary | ICD-10-CM | POA: Diagnosis not present

## 2022-07-02 DIAGNOSIS — E291 Testicular hypofunction: Secondary | ICD-10-CM | POA: Diagnosis not present

## 2022-07-02 DIAGNOSIS — R030 Elevated blood-pressure reading, without diagnosis of hypertension: Secondary | ICD-10-CM | POA: Diagnosis not present

## 2022-07-16 DIAGNOSIS — E291 Testicular hypofunction: Secondary | ICD-10-CM | POA: Diagnosis not present

## 2022-07-30 DIAGNOSIS — E291 Testicular hypofunction: Secondary | ICD-10-CM | POA: Diagnosis not present

## 2022-08-10 DIAGNOSIS — N1831 Chronic kidney disease, stage 3a: Secondary | ICD-10-CM | POA: Diagnosis not present

## 2022-08-10 DIAGNOSIS — Z1329 Encounter for screening for other suspected endocrine disorder: Secondary | ICD-10-CM | POA: Diagnosis not present

## 2022-08-10 DIAGNOSIS — E291 Testicular hypofunction: Secondary | ICD-10-CM | POA: Diagnosis not present

## 2022-08-10 DIAGNOSIS — R5383 Other fatigue: Secondary | ICD-10-CM | POA: Diagnosis not present

## 2022-08-10 DIAGNOSIS — E7849 Other hyperlipidemia: Secondary | ICD-10-CM | POA: Diagnosis not present

## 2022-08-10 DIAGNOSIS — I10 Essential (primary) hypertension: Secondary | ICD-10-CM | POA: Diagnosis not present

## 2022-08-13 DIAGNOSIS — N1831 Chronic kidney disease, stage 3a: Secondary | ICD-10-CM | POA: Diagnosis not present

## 2022-08-13 DIAGNOSIS — F331 Major depressive disorder, recurrent, moderate: Secondary | ICD-10-CM | POA: Diagnosis not present

## 2022-08-13 DIAGNOSIS — R69 Illness, unspecified: Secondary | ICD-10-CM | POA: Diagnosis not present

## 2022-08-13 DIAGNOSIS — E7849 Other hyperlipidemia: Secondary | ICD-10-CM | POA: Diagnosis not present

## 2022-08-13 DIAGNOSIS — F900 Attention-deficit hyperactivity disorder, predominantly inattentive type: Secondary | ICD-10-CM | POA: Diagnosis not present

## 2022-08-13 DIAGNOSIS — Z6828 Body mass index (BMI) 28.0-28.9, adult: Secondary | ICD-10-CM | POA: Diagnosis not present

## 2022-08-13 DIAGNOSIS — I10 Essential (primary) hypertension: Secondary | ICD-10-CM | POA: Diagnosis not present

## 2022-08-13 DIAGNOSIS — F5221 Male erectile disorder: Secondary | ICD-10-CM | POA: Diagnosis not present

## 2022-08-13 DIAGNOSIS — R4582 Worries: Secondary | ICD-10-CM | POA: Diagnosis not present

## 2022-08-17 DIAGNOSIS — E291 Testicular hypofunction: Secondary | ICD-10-CM | POA: Diagnosis not present

## 2022-09-01 DIAGNOSIS — E291 Testicular hypofunction: Secondary | ICD-10-CM | POA: Diagnosis not present

## 2022-09-15 DIAGNOSIS — R030 Elevated blood-pressure reading, without diagnosis of hypertension: Secondary | ICD-10-CM | POA: Diagnosis not present

## 2022-09-15 DIAGNOSIS — E291 Testicular hypofunction: Secondary | ICD-10-CM | POA: Diagnosis not present

## 2022-09-29 DIAGNOSIS — E291 Testicular hypofunction: Secondary | ICD-10-CM | POA: Diagnosis not present

## 2022-09-29 DIAGNOSIS — R030 Elevated blood-pressure reading, without diagnosis of hypertension: Secondary | ICD-10-CM | POA: Diagnosis not present

## 2022-10-13 DIAGNOSIS — R030 Elevated blood-pressure reading, without diagnosis of hypertension: Secondary | ICD-10-CM | POA: Diagnosis not present

## 2022-10-13 DIAGNOSIS — E291 Testicular hypofunction: Secondary | ICD-10-CM | POA: Diagnosis not present

## 2022-10-27 DIAGNOSIS — E291 Testicular hypofunction: Secondary | ICD-10-CM | POA: Diagnosis not present

## 2022-10-27 DIAGNOSIS — R030 Elevated blood-pressure reading, without diagnosis of hypertension: Secondary | ICD-10-CM | POA: Diagnosis not present

## 2022-11-10 DIAGNOSIS — R030 Elevated blood-pressure reading, without diagnosis of hypertension: Secondary | ICD-10-CM | POA: Diagnosis not present

## 2022-11-10 DIAGNOSIS — E291 Testicular hypofunction: Secondary | ICD-10-CM | POA: Diagnosis not present

## 2022-11-24 DIAGNOSIS — E291 Testicular hypofunction: Secondary | ICD-10-CM | POA: Diagnosis not present

## 2022-11-24 DIAGNOSIS — R030 Elevated blood-pressure reading, without diagnosis of hypertension: Secondary | ICD-10-CM | POA: Diagnosis not present

## 2022-12-04 DIAGNOSIS — E782 Mixed hyperlipidemia: Secondary | ICD-10-CM | POA: Diagnosis not present

## 2022-12-04 DIAGNOSIS — Z125 Encounter for screening for malignant neoplasm of prostate: Secondary | ICD-10-CM | POA: Diagnosis not present

## 2022-12-04 DIAGNOSIS — N183 Chronic kidney disease, stage 3 unspecified: Secondary | ICD-10-CM | POA: Diagnosis not present

## 2022-12-04 DIAGNOSIS — E7849 Other hyperlipidemia: Secondary | ICD-10-CM | POA: Diagnosis not present

## 2022-12-04 DIAGNOSIS — F17210 Nicotine dependence, cigarettes, uncomplicated: Secondary | ICD-10-CM | POA: Diagnosis not present

## 2022-12-04 DIAGNOSIS — I10 Essential (primary) hypertension: Secondary | ICD-10-CM | POA: Diagnosis not present

## 2022-12-04 DIAGNOSIS — R69 Illness, unspecified: Secondary | ICD-10-CM | POA: Diagnosis not present

## 2022-12-04 DIAGNOSIS — Z6828 Body mass index (BMI) 28.0-28.9, adult: Secondary | ICD-10-CM | POA: Diagnosis not present

## 2022-12-04 DIAGNOSIS — Z0001 Encounter for general adult medical examination with abnormal findings: Secondary | ICD-10-CM | POA: Diagnosis not present

## 2022-12-04 DIAGNOSIS — Z1329 Encounter for screening for other suspected endocrine disorder: Secondary | ICD-10-CM | POA: Diagnosis not present

## 2022-12-07 DIAGNOSIS — R030 Elevated blood-pressure reading, without diagnosis of hypertension: Secondary | ICD-10-CM | POA: Diagnosis not present

## 2022-12-07 DIAGNOSIS — E291 Testicular hypofunction: Secondary | ICD-10-CM | POA: Diagnosis not present

## 2022-12-08 DIAGNOSIS — R69 Illness, unspecified: Secondary | ICD-10-CM | POA: Diagnosis not present

## 2022-12-08 DIAGNOSIS — F331 Major depressive disorder, recurrent, moderate: Secondary | ICD-10-CM | POA: Diagnosis not present

## 2022-12-08 DIAGNOSIS — Z6828 Body mass index (BMI) 28.0-28.9, adult: Secondary | ICD-10-CM | POA: Diagnosis not present

## 2022-12-08 DIAGNOSIS — Z23 Encounter for immunization: Secondary | ICD-10-CM | POA: Diagnosis not present

## 2022-12-08 DIAGNOSIS — I10 Essential (primary) hypertension: Secondary | ICD-10-CM | POA: Diagnosis not present

## 2022-12-08 DIAGNOSIS — R4582 Worries: Secondary | ICD-10-CM | POA: Diagnosis not present

## 2022-12-08 DIAGNOSIS — F900 Attention-deficit hyperactivity disorder, predominantly inattentive type: Secondary | ICD-10-CM | POA: Diagnosis not present

## 2022-12-08 DIAGNOSIS — Z0001 Encounter for general adult medical examination with abnormal findings: Secondary | ICD-10-CM | POA: Diagnosis not present

## 2022-12-08 DIAGNOSIS — E7849 Other hyperlipidemia: Secondary | ICD-10-CM | POA: Diagnosis not present

## 2022-12-08 DIAGNOSIS — F5221 Male erectile disorder: Secondary | ICD-10-CM | POA: Diagnosis not present

## 2022-12-08 DIAGNOSIS — N1831 Chronic kidney disease, stage 3a: Secondary | ICD-10-CM | POA: Diagnosis not present

## 2022-12-18 ENCOUNTER — Emergency Department (HOSPITAL_COMMUNITY): Payer: Medicare HMO

## 2022-12-18 ENCOUNTER — Inpatient Hospital Stay (HOSPITAL_COMMUNITY)
Admission: EM | Admit: 2022-12-18 | Discharge: 2022-12-22 | DRG: 871 | Disposition: A | Discharge status: Home / Self Care | Payer: Medicare HMO | Attending: Family Medicine | Admitting: Family Medicine

## 2022-12-18 ENCOUNTER — Other Ambulatory Visit: Payer: Self-pay

## 2022-12-18 ENCOUNTER — Encounter (HOSPITAL_COMMUNITY): Payer: Self-pay | Admitting: Emergency Medicine

## 2022-12-18 DIAGNOSIS — R059 Cough, unspecified: Secondary | ICD-10-CM | POA: Diagnosis not present

## 2022-12-18 DIAGNOSIS — Z1152 Encounter for screening for COVID-19: Secondary | ICD-10-CM

## 2022-12-18 DIAGNOSIS — E876 Hypokalemia: Secondary | ICD-10-CM | POA: Diagnosis not present

## 2022-12-18 DIAGNOSIS — A403 Sepsis due to Streptococcus pneumoniae: Secondary | ICD-10-CM | POA: Diagnosis not present

## 2022-12-18 DIAGNOSIS — N179 Acute kidney failure, unspecified: Secondary | ICD-10-CM | POA: Diagnosis not present

## 2022-12-18 DIAGNOSIS — J210 Acute bronchiolitis due to respiratory syncytial virus: Secondary | ICD-10-CM

## 2022-12-18 DIAGNOSIS — F419 Anxiety disorder, unspecified: Secondary | ICD-10-CM | POA: Diagnosis present

## 2022-12-18 DIAGNOSIS — F32A Depression, unspecified: Secondary | ICD-10-CM | POA: Diagnosis present

## 2022-12-18 DIAGNOSIS — B974 Respiratory syncytial virus as the cause of diseases classified elsewhere: Secondary | ICD-10-CM | POA: Diagnosis present

## 2022-12-18 DIAGNOSIS — R000 Tachycardia, unspecified: Secondary | ICD-10-CM | POA: Diagnosis not present

## 2022-12-18 DIAGNOSIS — E860 Dehydration: Secondary | ICD-10-CM | POA: Diagnosis not present

## 2022-12-18 DIAGNOSIS — J13 Pneumonia due to Streptococcus pneumoniae: Secondary | ICD-10-CM | POA: Diagnosis not present

## 2022-12-18 DIAGNOSIS — Z79899 Other long term (current) drug therapy: Secondary | ICD-10-CM

## 2022-12-18 DIAGNOSIS — J189 Pneumonia, unspecified organism: Secondary | ICD-10-CM

## 2022-12-18 DIAGNOSIS — R7881 Bacteremia: Secondary | ICD-10-CM | POA: Diagnosis not present

## 2022-12-18 DIAGNOSIS — R7989 Other specified abnormal findings of blood chemistry: Secondary | ICD-10-CM | POA: Diagnosis not present

## 2022-12-18 DIAGNOSIS — R0602 Shortness of breath: Secondary | ICD-10-CM | POA: Diagnosis not present

## 2022-12-18 DIAGNOSIS — B953 Streptococcus pneumoniae as the cause of diseases classified elsewhere: Secondary | ICD-10-CM | POA: Diagnosis not present

## 2022-12-18 DIAGNOSIS — B338 Other specified viral diseases: Secondary | ICD-10-CM

## 2022-12-18 DIAGNOSIS — D72829 Elevated white blood cell count, unspecified: Secondary | ICD-10-CM | POA: Diagnosis present

## 2022-12-18 DIAGNOSIS — J90 Pleural effusion, not elsewhere classified: Secondary | ICD-10-CM | POA: Diagnosis not present

## 2022-12-18 DIAGNOSIS — A419 Sepsis, unspecified organism: Principal | ICD-10-CM

## 2022-12-18 DIAGNOSIS — R69 Illness, unspecified: Secondary | ICD-10-CM | POA: Diagnosis not present

## 2022-12-18 LAB — CBC WITH DIFFERENTIAL/PLATELET
Abs Immature Granulocytes: 0.2 10*3/uL — ABNORMAL HIGH (ref 0.00–0.07)
Band Neutrophils: 5 %
Basophils Absolute: 0 10*3/uL (ref 0.0–0.1)
Basophils Relative: 0 %
Eosinophils Absolute: 0 10*3/uL (ref 0.0–0.5)
Eosinophils Relative: 0 %
HCT: 44.9 % (ref 39.0–52.0)
Hemoglobin: 15.6 g/dL (ref 13.0–17.0)
Lymphocytes Relative: 5 %
Lymphs Abs: 1.2 10*3/uL (ref 0.7–4.0)
MCH: 30.6 pg (ref 26.0–34.0)
MCHC: 34.7 g/dL (ref 30.0–36.0)
MCV: 88.2 fL (ref 80.0–100.0)
Metamyelocytes Relative: 1 %
Monocytes Absolute: 0.7 10*3/uL (ref 0.1–1.0)
Monocytes Relative: 3 %
Neutro Abs: 21.6 10*3/uL — ABNORMAL HIGH (ref 1.7–7.7)
Neutrophils Relative %: 86 %
Platelets: 265 10*3/uL (ref 150–400)
RBC: 5.09 MIL/uL (ref 4.22–5.81)
RDW: 13.1 % (ref 11.5–15.5)
WBC: 23.7 10*3/uL — ABNORMAL HIGH (ref 4.0–10.5)
nRBC: 0 % (ref 0.0–0.2)

## 2022-12-18 LAB — PROTIME-INR
INR: 1.4 — ABNORMAL HIGH (ref 0.8–1.2)
Prothrombin Time: 16.9 seconds — ABNORMAL HIGH (ref 11.4–15.2)

## 2022-12-18 LAB — URINALYSIS, ROUTINE W REFLEX MICROSCOPIC
Bacteria, UA: NONE SEEN
Bilirubin Urine: NEGATIVE
Glucose, UA: NEGATIVE mg/dL
Ketones, ur: 5 mg/dL — AB
Leukocytes,Ua: NEGATIVE
Nitrite: NEGATIVE
Protein, ur: 100 mg/dL — AB
RBC / HPF: >50 RBC/hpf — ABNORMAL HIGH (ref 0–5)
Specific Gravity, Urine: 1.027 (ref 1.005–1.030)
pH: 6 (ref 5.0–8.0)

## 2022-12-18 LAB — COMPREHENSIVE METABOLIC PANEL
ALT: 96 U/L — ABNORMAL HIGH (ref 0–44)
AST: 42 U/L — ABNORMAL HIGH (ref 15–41)
Albumin: 3 g/dL — ABNORMAL LOW (ref 3.5–5.0)
Alkaline Phosphatase: 95 U/L (ref 38–126)
Anion gap: 11 (ref 5–15)
BUN: 25 mg/dL — ABNORMAL HIGH (ref 8–23)
CO2: 25 mmol/L (ref 22–32)
Calcium: 7.8 mg/dL — ABNORMAL LOW (ref 8.9–10.3)
Chloride: 94 mmol/L — ABNORMAL LOW (ref 98–111)
Creatinine, Ser: 1.72 mg/dL — ABNORMAL HIGH (ref 0.61–1.24)
GFR, Estimated: 43 mL/min — ABNORMAL LOW (ref >60)
Glucose, Bld: 125 mg/dL — ABNORMAL HIGH (ref 70–99)
Potassium: 3.3 mmol/L — ABNORMAL LOW (ref 3.5–5.1)
Sodium: 130 mmol/L — ABNORMAL LOW (ref 135–145)
Total Bilirubin: 0.7 mg/dL (ref 0.3–1.2)
Total Protein: 7.4 g/dL (ref 6.5–8.1)

## 2022-12-18 LAB — APTT: aPTT: 33 seconds (ref 24–36)

## 2022-12-18 LAB — HIV ANTIBODY (ROUTINE TESTING W REFLEX): HIV Screen 4th Generation wRfx: NONREACTIVE

## 2022-12-18 LAB — MAGNESIUM: Magnesium: 2.3 mg/dL (ref 1.7–2.4)

## 2022-12-18 LAB — STREP PNEUMONIAE URINARY ANTIGEN: Strep Pneumo Urinary Antigen: POSITIVE — AB

## 2022-12-18 LAB — RESP PANEL BY RT-PCR (RSV, FLU A&B, COVID)  RVPGX2
Influenza A by PCR: NEGATIVE
Influenza B by PCR: NEGATIVE
Resp Syncytial Virus by PCR: POSITIVE — AB
SARS Coronavirus 2 by RT PCR: NEGATIVE

## 2022-12-18 LAB — PROCALCITONIN: Procalcitonin: >150 ng/mL

## 2022-12-18 LAB — LACTIC ACID, PLASMA: Lactic Acid, Venous: 1.8 mmol/L (ref 0.5–1.9)

## 2022-12-18 MED ORDER — SODIUM CHLORIDE 0.9 % IV SOLN: INTRAVENOUS | Status: AC

## 2022-12-18 MED ORDER — DEXAMETHASONE SODIUM PHOSPHATE 4 MG/ML IJ SOLN
4.0000 mg | Freq: Two times a day (BID) | INTRAMUSCULAR | Status: AC
  Administered 2022-12-18 – 2022-12-19 (×3): 4 mg via INTRAVENOUS
  Filled 2022-12-18 (×3): qty 1

## 2022-12-18 MED ORDER — ENOXAPARIN SODIUM 40 MG/0.4ML IJ SOSY
40.0000 mg | PREFILLED_SYRINGE | INTRAMUSCULAR | Status: DC
  Administered 2022-12-18 – 2022-12-21 (×4): 40 mg via SUBCUTANEOUS
  Filled 2022-12-18 (×4): qty 0.4

## 2022-12-18 MED ORDER — ONDANSETRON HCL 4 MG/2ML IJ SOLN
4.0000 mg | Freq: Four times a day (QID) | INTRAMUSCULAR | Status: DC | PRN
  Administered 2022-12-21: 4 mg via INTRAVENOUS
  Filled 2022-12-18: qty 2

## 2022-12-18 MED ORDER — DEXAMETHASONE SODIUM PHOSPHATE 4 MG/ML IJ SOLN: 4.0000 mg | Freq: Two times a day (BID) | INTRAMUSCULAR | Status: DC

## 2022-12-18 MED ORDER — ACETAMINOPHEN 650 MG RE SUPP: 650.0000 mg | RECTAL | Status: DC

## 2022-12-18 MED ORDER — POTASSIUM CHLORIDE 10 MEQ/100ML IV SOLN
10.0000 meq | INTRAVENOUS | Status: AC
  Administered 2022-12-18 (×3): 10 meq via INTRAVENOUS
  Filled 2022-12-18: qty 100

## 2022-12-18 MED ORDER — IPRATROPIUM-ALBUTEROL 0.5-2.5 (3) MG/3ML IN SOLN
3.0000 mL | RESPIRATORY_TRACT | Status: DC
  Administered 2022-12-18 – 2022-12-19 (×6): 3 mL via RESPIRATORY_TRACT
  Filled 2022-12-18 (×6): qty 3

## 2022-12-18 MED ORDER — ZOLPIDEM TARTRATE 5 MG PO TABS: 5.0000 mg | ORAL_TABLET | Freq: Every evening | ORAL | Status: DC | PRN

## 2022-12-18 MED ORDER — ONDANSETRON HCL 4 MG PO TABS: 4.0000 mg | ORAL_TABLET | Freq: Four times a day (QID) | ORAL | Status: DC | PRN

## 2022-12-18 MED ORDER — FENTANYL CITRATE PF 50 MCG/ML IJ SOSY
12.5000 ug | PREFILLED_SYRINGE | INTRAMUSCULAR | Status: DC | PRN
  Filled 2022-12-18: qty 1

## 2022-12-18 MED ORDER — ACETAMINOPHEN 325 MG PO TABS
650.0000 mg | ORAL_TABLET | ORAL | Status: DC
  Administered 2022-12-18 – 2022-12-22 (×20): 650 mg via ORAL
  Filled 2022-12-18 (×20): qty 2

## 2022-12-18 MED ORDER — DEXTROMETHORPHAN POLISTIREX ER 30 MG/5ML PO SUER: 30.0000 mg | Freq: Two times a day (BID) | ORAL | Status: DC | PRN

## 2022-12-18 MED ORDER — ACETAMINOPHEN 325 MG PO TABS: 650.0000 mg | ORAL_TABLET | Freq: Four times a day (QID) | ORAL | Status: DC | PRN

## 2022-12-18 MED ORDER — ACETAMINOPHEN 325 MG PO TABS
650.0000 mg | ORAL_TABLET | Freq: Once | ORAL | Status: AC
  Administered 2022-12-18: 650 mg via ORAL
  Filled 2022-12-18: qty 2

## 2022-12-18 MED ORDER — SODIUM CHLORIDE 0.9 % IV SOLN
2.0000 g | INTRAVENOUS | Status: AC
  Administered 2022-12-18 – 2022-12-22 (×5): 2 g via INTRAVENOUS
  Filled 2022-12-18 (×6): qty 20

## 2022-12-18 MED ORDER — OXYCODONE HCL 5 MG PO TABS: 5.0000 mg | ORAL_TABLET | ORAL | Status: DC | PRN

## 2022-12-18 MED ORDER — SODIUM CHLORIDE 0.9 % IV SOLN
500.0000 mg | INTRAVENOUS | Status: DC
  Administered 2022-12-18: 500 mg via INTRAVENOUS
  Filled 2022-12-18: qty 5

## 2022-12-18 MED ORDER — ACETAMINOPHEN 650 MG RE SUPP: 650.0000 mg | Freq: Four times a day (QID) | RECTAL | Status: DC | PRN

## 2022-12-18 MED ORDER — GUAIFENESIN ER 600 MG PO TB12
1200.0000 mg | ORAL_TABLET | Freq: Two times a day (BID) | ORAL | Status: DC
  Administered 2022-12-18 – 2022-12-22 (×9): 1200 mg via ORAL
  Filled 2022-12-18 (×9): qty 2

## 2022-12-18 MED ORDER — BISACODYL 5 MG PO TBEC: 5.0000 mg | DELAYED_RELEASE_TABLET | Freq: Every day | ORAL | Status: DC | PRN

## 2022-12-18 NOTE — H&P (Signed)
History and Physical  [place] PFX:[id number] DOB: [birth year] DOA: 12/18/2022  PCP: Caryl Bis, MD  Patient coming from: Home  Level of care: Med-Surg  I have personally briefly reviewed patient's old medical records in [place]  Chief Complaint: SOB   HPI: Edward Jacobson is a  66 y/o male nonsmoker with hypogonadism, h/o nephrolithiasis, depression reported having progressive cough, chest congestion, weakness for last 10 days.  He reports sick contacts at home with viral illnesses.  Patient reported he had high fever at home up to 102 degrees.  He has had severe malaise symptoms.  He says he has not felt this bad in a very long time.  He is having a productive cough with yellow-green sputum production.  He denies chills.  He denies nausea vomiting diarrhea.  In the ED he tested positive for RSV.  He also had a chest x-ray with findings of left lower lobe consolidation and infiltrate.  He was febrile with a temperature of 101.4.  He was tachycardic as well.  He was started on sepsis protocol and admission requested for further management.    Past Medical History:  Diagnosis Date   Anxiety    Depression    Renal disorder    kidneys stones    Past Surgical History:  Procedure Laterality Date   FOREIGN BODY REMOVAL Left 07/13/2018   Procedure: REMOVAL FOREIGN BODY LEFT FOOT;  Surgeon: Tyson Babinski, DPM;  Location: [facility];  Service: Podiatry;  Laterality: Left;   SHOULDER SURGERY Left      reports that he has never smoked. He has never used smokeless tobacco. He reports that he does not drink alcohol and does not use drugs.  No Known Allergies  History reviewed. No pertinent family history.  Prior to Admission medications   Medication Sig Start Date End Date Taking? Authorizing Provider  buPROPion (WELLBUTRIN XL) 300 MG 24 hr tablet Take 300 mg by mouth daily.    [provider]  CREATINE PO Take 5 g by mouth daily.    [provider]  FLUoxetine (PROZAC) 20 MG capsule Take 20 mg by mouth 3 (three) times daily.     [provider]  GINSENG PO Take 300 mg by mouth daily.     [provider]  L-ARGININE PO Take 3,000 mg by mouth daily.     [provider]  Multiple Vitamin (MULTIVITAMIN WITH MINERALS) TABS tablet Take 1 tablet by mouth daily.    [provider]  ondansetron (ZOFRAN ODT) 4 MG disintegrating tablet Take 1 tablet (4 mg total) by mouth every 8 (eight) hours as needed for nausea. Patient not taking: Reported on 07/08/2018 04/24/15   Tanna Furry, MD  oxyCODONE-acetaminophen (PERCOCET/ROXICET) 5-325 MG per tablet Take 2 tablets by mouth every 4 (four) hours as needed. Patient not taking: Reported on 07/08/2018 04/24/15   Tanna Furry, MD  PROTEIN PO Take 20 g by mouth daily.    [provider]  tamsulosin (FLOMAX) 0.4 MG CAPS capsule Take 1 capsule (0.4 mg total) by mouth daily. Patient not taking: Reported on 07/08/2018 04/24/15   Tanna Furry, MD    Physical Exam: Vitals:   12/18/22 1130 12/18/22 1132 12/18/22 1200 12/18/22 1230  BP: 127/73  139/79 132/80  Pulse: (!) 103  (!) 109 (!) 106  Resp: (!) 21  (!) 27 18  Temp:  (!) 101.4 F (38.6 C)    TempSrc:  Axillary  SpO2: 96%  93% 95%  Weight:      Height:        Constitutional: NAD, calm, comfortable Eyes: PERRL, lids and conjunctivae normal ENMT: Mucous membranes are moist. Posterior pharynx clear of any exudate or lesions.Normal dentition.  Neck: normal, supple, no masses, no thyromegaly Respiratory: rales and diminished BS LLL.  No crackles heard.   Cardiovascular: tachycardic rate, normal s1, s2 sounds, no murmurs / rubs / gallops. No extremity edema. 2+ pedal pulses. No carotid bruits.  Abdomen: no tenderness, no masses palpated. No hepatosplenomegaly. Bowel sounds positive.  Musculoskeletal: no clubbing / cyanosis. No joint deformity upper and lower extremities. Good ROM, no contractures.  Normal muscle tone.  Skin: no rashes, lesions, ulcers. No induration Neurologic: CN 2-12 grossly intact. Sensation intact, DTR normal. Strength 5/5 in all 4.  Psychiatric: Normal judgment and insight. Alert and oriented x 3. Normal mood.   Labs on Admission: I have personally reviewed following labs and imaging studies  CBC: Recent Labs  Lab 12/18/22 1047  WBC 23.7*  NEUTROABS 21.6*  HGB 15.6  HCT 44.9  MCV 88.2  PLT 562   Basic Metabolic Panel: Recent Labs  Lab 12/18/22 1047  NA 130*  K 3.3*  CL 94*  CO2 25  GLUCOSE 125*  BUN 25*  CREATININE 1.72*  CALCIUM 7.8*   GFR: Estimated Creatinine Clearance: 43 mL/min (A) (by C-G formula based on SCr of 1.72 mg/dL (H)). Liver Function Tests: Recent Labs  Lab 12/18/22 1047  AST 42*  ALT 96*  ALKPHOS 95  BILITOT 0.7  PROT 7.4  ALBUMIN 3.0*   No results for input(s): "LIPASE", "AMYLASE" in the last 168 hours. No results for input(s): "AMMONIA" in the last 168 hours. Coagulation Profile: Recent Labs  Lab 12/18/22 1047  INR 1.4*   Cardiac Enzymes: No results for input(s): "CKTOTAL", "CKMB", "CKMBINDEX", "TROPONINI" in the last 168 hours. BNP (last 3 results) No results for input(s): "PROBNP" in the last 8760 hours. HbA1C: No results for input(s): "HGBA1C" in the last 72 hours. CBG: No results for input(s): "GLUCAP" in the last 168 hours. Lipid Profile: No results for input(s): "CHOL", "HDL", "LDLCALC", "TRIG", "CHOLHDL", "LDLDIRECT" in the last 72 hours. Thyroid Function Tests: No results for input(s): "TSH", "T4TOTAL", "FREET4", "T3FREE", "THYROIDAB" in the last 72 hours. Anemia Panel: No results for input(s): "VITAMINB12", "FOLATE", "FERRITIN", "TIBC", "IRON", "RETICCTPCT" in the last 72 hours. Urine analysis:    Component Value Date/Time   COLORURINE YELLOW 12/18/2022 1225   APPEARANCEUR HAZY (A) 12/18/2022 1225   LABSPEC 1.027 12/18/2022 1225   PHURINE 6.0 12/18/2022 1225   GLUCOSEU NEGATIVE 12/18/2022  1225   HGBUR MODERATE (A) 12/18/2022 1225   BILIRUBINUR NEGATIVE 12/18/2022 1225   KETONESUR 5 (A) 12/18/2022 1225   PROTEINUR 100 (A) 12/18/2022 1225   UROBILINOGEN 0.2 04/24/2015 1945   NITRITE NEGATIVE 12/18/2022 1225   LEUKOCYTESUR NEGATIVE 12/18/2022 1225    Radiological Exams on Admission: DG Chest Port 1 View  Result Date: 12/18/2022 CLINICAL DATA:  SOB cough EXAM: PORTABLE CHEST 1 VIEW COMPARISON:  08/18/2009 FINDINGS: The heart size and mediastinal contours are within normal limits. Left basilar opacity consistent with layering effusion. Left basilar consolidation. Right lung clear. Normal pulmonary vasculature. IMPRESSION: Left base consolidation and pleural effusion Electronically Signed   By: Sammie Bench [name]   On: 12/18/2022 10:47    EKG: Independently reviewed.   Assessment/Plan Principal Problem:   LLL pneumonia Active Problems:   Leukocytosis   AKI (acute kidney  injury) ([place])   Dehydration   RSV (respiratory syncytial virus infection)   Hypokalemia   Elevated LFTs   LLL Pneumonia  - Pt presented with sepsis physiology - continue broad spectrum IV antibiotics - follow sputum and blood cultures - continue supportive measures - give dose of IV steroid.   Leukocytosis - follow CBC/diff with treatment  - secondary to pneumonia  AKI - prerenal from dehydration - treating with IV fluid  - recheck BMP in AM   Elevated LFTs - recheck in AM after supportive measures   Hypokalemia  - IV replacement, check Mg  - repeat BMP in AM   Sepsis secondary to pneumonia  - continue sepsis orderset bundle  DVT prophylaxis: enoxaparin   Code Status: Full   Family Communication: none present   Disposition Plan: anticipate home   Consults called: n/a   Admission status: INP  Level of care: Med-Surg Irwin Brakeman MD [facility] How to contact the [facility] Attending or Consulting provider [place] or covering provider during after hours 7P -7A, for this  patient?  Check the care team in [facility] and look for a) attending/consulting [facility] provider listed and b) the [facility] team listed Log into [email or web address] and use [place]'s universal password to access. If you do not have the password, please contact the hospital operator. Locate the [facility] provider you are looking for under [facility] and page to a number that you can be directly reached. If you still have difficulty reaching the provider, please page the [facility] (Director on Call) for the Hospitalists listed on amion for assistance.   If 7PM-7AM, please contact night-coverage [email or web address] Password [facility]  12/18/2022, 1:54 PM

## 2022-12-18 NOTE — ED Triage Notes (Addendum)
Pt co sob and cough x 10 days. Pt tachypneic/labored breathing in triage. Encouraged to take slow deep breaths. Pt color slightly gray, sats ra in triage 86-89. Pt c/o pain to left rib area radiating into left chest and shoulder now. A/o. Shaky to upper body

## 2022-12-18 NOTE — ED Notes (Addendum)
Pt given bedside commode and using at this time.

## 2022-12-18 NOTE — Hospital Course (Addendum)
66 y/o male nonsmoker with hypogonadism, h/o nephrolithiasis, depression reported having progressive cough, chest congestion, weakness for last 10 days.  He reports sick contacts at home with viral illnesses.  Patient reported he had high fever at home up to 102 degrees.  He has had severe malaise symptoms.  He says he has not felt this bad in a very long time.  He is having a productive cough with yellow-green sputum production.  He denies chills.  He denies nausea vomiting diarrhea.  In the ED he tested positive for RSV.  He also had a chest x-ray with findings of left lower lobe consolidation and infiltrate.  He was febrile with a temperature of 101.4.  He was tachycardic as well.  He was started on sepsis protocol and admission requested for further management.

## 2022-12-18 NOTE — Sepsis Progress Note (Signed)
Elink following code sepsis °

## 2022-12-18 NOTE — ED Provider Notes (Signed)
[facility] EMERGENCY DEPARTMENT Provider Note   CSN: [id number] Arrival date & time: 12/18/22  1005     History  Chief Complaint  Patient presents with   Shortness of Breath   Cough    Edward Jacobson is a 66 y.o. male.   Shortness of Breath Associated symptoms: cough   Cough Associated symptoms: shortness of breath      This patient is a 66 year old male, he denies any history of chronic lung disease or tobacco use, he presents with a complaint of increasing shortness of breath and a cough which has been going on for about 10 days but gradually getting worse.  Today he noticed that he was becoming increasingly weak and short of breath and was noted to have a fever tachycardia and hypoxia on arrival.  The patient does not use home oxygen and denies any chronic cardiac disease either.  Has been bringing up purulent sputum  Home Medications Prior to Admission medications   Medication Sig Start Date End Date Taking? Authorizing Provider  buPROPion (WELLBUTRIN XL) 300 MG 24 hr tablet Take 300 mg by mouth daily.    [provider]  CREATINE PO Take 5 g by mouth daily.    [provider]  FLUoxetine (PROZAC) 20 MG capsule Take 20 mg by mouth 3 (three) times daily.     [provider]  GINSENG PO Take 300 mg by mouth daily.     [provider]  L-ARGININE PO Take 3,000 mg by mouth daily.     [provider]  Multiple Vitamin (MULTIVITAMIN WITH MINERALS) TABS tablet Take 1 tablet by mouth daily.    [provider]  ondansetron (ZOFRAN ODT) 4 MG disintegrating tablet Take 1 tablet (4 mg total) by mouth every 8 (eight) hours as needed for nausea. Patient not taking: Reported on 07/08/2018 04/24/15   Tanna Furry, MD  oxyCODONE-acetaminophen (PERCOCET/ROXICET) 5-325 MG per tablet Take 2 tablets by mouth every 4 (four) hours as needed. Patient not taking: Reported on 07/08/2018 04/24/15   Tanna Furry, MD  PROTEIN PO Take 20 g by mouth  daily.    [provider]  tamsulosin (FLOMAX) 0.4 MG CAPS capsule Take 1 capsule (0.4 mg total) by mouth daily. Patient not taking: Reported on 07/08/2018 04/24/15   Tanna Furry, MD      Allergies    Patient has no known allergies.    Review of Systems   Review of Systems  Respiratory:  Positive for cough and shortness of breath.   All other systems reviewed and are negative.   Physical Exam Updated Vital Signs BP 132/80   Pulse (!) 106   Temp (!) 101.4 F (38.6 C) (Axillary)   Resp 18   Ht 1.778 m ('5\' 10"'$ )   Wt 83.5 kg   SpO2 95%   BMI 26.40 kg/m  Physical Exam Vitals and nursing note reviewed.  Constitutional:      General: He is in acute distress.     Appearance: He is well-developed. He is ill-appearing and toxic-appearing.  HENT:     Head: Normocephalic and atraumatic.     Mouth/Throat:     Mouth: Mucous membranes are dry.     Pharynx: No oropharyngeal exudate.  Eyes:     General: No scleral icterus.       Right eye: No discharge.        Left eye: No discharge.     Conjunctiva/sclera: Conjunctivae normal.     Pupils: Pupils  are equal, round, and reactive to light.  Neck:     Thyroid: No thyromegaly.     Vascular: No JVD.  Cardiovascular:     Rate and Rhythm: Regular rhythm. Tachycardia present.     Heart sounds: Normal heart sounds. No murmur heard.    No friction rub. No gallop.  Pulmonary:     Effort: Pulmonary effort is normal. No respiratory distress.     Breath sounds: Wheezing, rhonchi and rales present.     Comments: Decreased lung sounds in all lung fields, there are some rales at the bases but worse on the left Abdominal:     General: Bowel sounds are normal. There is no distension.     Palpations: Abdomen is soft. There is no mass.     Tenderness: There is no abdominal tenderness.  Musculoskeletal:        General: No tenderness. Normal range of motion.     Cervical back: Normal range of motion and neck supple.     Right lower leg: No  edema.     Left lower leg: No edema.  Lymphadenopathy:     Cervical: No cervical adenopathy.  Skin:    General: Skin is warm and dry.     Findings: No erythema or rash.  Neurological:     Mental Status: He is alert.     Coordination: Coordination normal.  Psychiatric:        Behavior: Behavior normal.     ED Results / Procedures / Treatments   Labs (all labs ordered are listed, but only abnormal results are displayed) Labs Reviewed  RESP PANEL BY RT-PCR (RSV, FLU A&B, COVID)  RVPGX2 - Abnormal; Notable for the following components:      Result Value   Resp Syncytial Virus by PCR POSITIVE (*)    All other components within normal limits  COMPREHENSIVE METABOLIC PANEL - Abnormal; Notable for the following components:   Sodium 130 (*)    Potassium 3.3 (*)    Chloride 94 (*)    Glucose, Bld 125 (*)    BUN 25 (*)    Creatinine, Ser 1.72 (*)    Calcium 7.8 (*)    Albumin 3.0 (*)    AST 42 (*)    ALT 96 (*)    GFR, Estimated 43 (*)    All other components within normal limits  CBC WITH DIFFERENTIAL/PLATELET - Abnormal; Notable for the following components:   WBC 23.7 (*)    Neutro Abs 21.6 (*)    Abs Immature Granulocytes 0.20 (*)    All other components within normal limits  PROTIME-INR - Abnormal; Notable for the following components:   Prothrombin Time 16.9 (*)    INR 1.4 (*)    All other components within normal limits  CULTURE, BLOOD (ROUTINE X 2)  CULTURE, BLOOD (ROUTINE X 2)  URINE CULTURE  LACTIC ACID, PLASMA  APTT  LACTIC ACID, PLASMA  URINALYSIS, ROUTINE W REFLEX MICROSCOPIC    EKG EKG Interpretation  Date/Time:  Friday December 18 2022 10:32:37 EST Ventricular Rate:  111 PR Interval:  146 QRS Duration: 107 QT Interval:  316 QTC Calculation: 430 R Axis:   -7 Text Interpretation: Sinus tachycardia Consider left atrial enlargement Left ventricular hypertrophy Confirmed by Noemi Chapel [phone]) on 12/18/2022 10:59:21 AM  Radiology DG Chest Port 1  View  Result Date: 12/18/2022 CLINICAL DATA:  SOB cough EXAM: PORTABLE CHEST 1 VIEW COMPARISON:  08/18/2009 FINDINGS: The heart size and mediastinal contours are within normal  limits. Left basilar opacity consistent with layering effusion. Left basilar consolidation. Right lung clear. Normal pulmonary vasculature. IMPRESSION: Left base consolidation and pleural effusion Electronically Signed   By: Sammie Bench [name]   On: 12/18/2022 10:47    Procedures .Critical Care  Performed by: Noemi Chapel, MD Authorized by: Noemi Chapel, MD   Critical care provider statement:    Critical care time (minutes):  45   Critical care time was exclusive of:  Teaching time and separately billable procedures and treating other patients   Critical care was necessary to treat or prevent imminent or life-threatening deterioration of the following conditions:  Sepsis and respiratory failure   Critical care was time spent personally by me on the following activities:  Development of treatment plan with patient or surrogate, discussions with consultants, evaluation of patient's response to treatment, examination of patient, ordering and review of laboratory studies, ordering and review of radiographic studies, ordering and performing treatments and interventions, pulse oximetry, re-evaluation of patient's condition, review of old charts and obtaining history from patient or surrogate   I assumed direction of critical care for this patient from another provider in my specialty: no     Care discussed with: admitting provider   Comments:           Medications Ordered in ED Medications  0.9 %  sodium chloride infusion ( Intravenous New Bag/Given 12/18/22 1048)  cefTRIAXone (ROCEPHIN) 2 g in sodium chloride 0.9 % 100 mL IVPB (0 g Intravenous Stopped 12/18/22 1131)  azithromycin (ZITHROMAX) 500 mg in sodium chloride 0.9 % 250 mL IVPB (500 mg Intravenous New Bag/Given 12/18/22 1130)  acetaminophen (TYLENOL) tablet  650 mg (650 mg Oral Given 12/18/22 1039)    ED Course/ Medical Decision Making/ A&P                           Medical Decision Making Amount and/or Complexity of Data Reviewed Labs: ordered. Radiology: ordered.  Risk OTC drugs. Prescription drug management. Decision regarding hospitalization.   This patient presents to the ED for concern of respiratory distress, this involves an extensive number of treatment options, and is a complaint that carries with it a high risk of complications and morbidity.  The differential diagnosis includes sepsis, pneumonia, pneumothorax   Co morbidities that complicate the patient evaluation  No significant chronic medical history   Additional history obtained:  Additional history obtained from electronic medical record External records from outside source obtained and reviewed including followed in the office for testicular hypofunctioning, taking Wellbutrin   Lab Tests:  I Ordered, and personally interpreted labs.  The pertinent results include: CBC significantly noted at over 20,000, lactate of 1.8, metabolic panel with creatinine of 1.7, this seems consistent with an acute kidney injury   Imaging Studies ordered:  I ordered imaging studies including chest x-ray I independently visualized and interpreted imaging which showed left lower lobe infiltrate I agree with the radiologist interpretation   Cardiac Monitoring: / EKG:  The patient was maintained on a cardiac monitor.  I personally viewed and interpreted the cardiac monitored which showed an underlying rhythm of: Sinus tachycardia   Consultations Obtained:  I requested consultation with the hospitalist,  and discussed lab and imaging findings as well as pertinent plan - they recommend: Admit to higher level of care   Problem List / ED Course / Critical interventions / Medication management  The patient has sepsis, there is no obvious pneumonia on the x-ray,  there is a  significant leukocytosis of 23,000 with a significant leftward shift, INR is 1.4, RSV positive, lactic acid 1.8.  I am concerned for acute kidney injury over the baseline creatinine is 1.59 thus this is not a relative worsening of kidney function and does not qualify for severe sepsis I ordered medication including Rocephin and Zithromax for pneumonia Reevaluation of the patient after these medicines showed that the patient critically ill I have reviewed the patients home medicines and have made adjustments as needed   Social Determinants of Health:  Patient is critically ill with sepsis from pneumonia   Test / Admission - Considered:  Admit to higher level of care         Final Clinical Impression(s) / ED Diagnoses Final diagnoses:  Sepsis, due to unspecified organism, unspecified whether acute organ dysfunction present ([place])  RSV (acute bronchiolitis due to respiratory syncytial virus)    Rx / DC Orders ED Discharge Orders     None         Noemi Chapel, MD 12/18/22 1254

## 2022-12-19 DIAGNOSIS — E876 Hypokalemia: Secondary | ICD-10-CM | POA: Diagnosis not present

## 2022-12-19 DIAGNOSIS — R7881 Bacteremia: Secondary | ICD-10-CM | POA: Diagnosis not present

## 2022-12-19 DIAGNOSIS — B953 Streptococcus pneumoniae as the cause of diseases classified elsewhere: Secondary | ICD-10-CM

## 2022-12-19 DIAGNOSIS — N179 Acute kidney failure, unspecified: Secondary | ICD-10-CM | POA: Diagnosis not present

## 2022-12-19 DIAGNOSIS — J189 Pneumonia, unspecified organism: Secondary | ICD-10-CM | POA: Diagnosis not present

## 2022-12-19 LAB — BLOOD CULTURE ID PANEL (REFLEXED) - BCID2

## 2022-12-19 LAB — CBC WITH DIFFERENTIAL/PLATELET
Abs Immature Granulocytes: 1.59 10*3/uL — ABNORMAL HIGH (ref 0.00–0.07)
Basophils Absolute: 0.2 10*3/uL — ABNORMAL HIGH (ref 0.0–0.1)
Basophils Relative: 1 %
Eosinophils Absolute: 0.1 10*3/uL (ref 0.0–0.5)
Eosinophils Relative: 0 %
HCT: 36.7 % — ABNORMAL LOW (ref 39.0–52.0)
Hemoglobin: 12.8 g/dL — ABNORMAL LOW (ref 13.0–17.0)
Immature Granulocytes: 7 %
Lymphocytes Relative: 3 %
Lymphs Abs: 0.7 10*3/uL (ref 0.7–4.0)
MCH: 30.8 pg (ref 26.0–34.0)
MCHC: 34.9 g/dL (ref 30.0–36.0)
MCV: 88.2 fL (ref 80.0–100.0)
Monocytes Absolute: 1 10*3/uL (ref 0.1–1.0)
Monocytes Relative: 4 %
Neutro Abs: 20.3 10*3/uL — ABNORMAL HIGH (ref 1.7–7.7)
Neutrophils Relative %: 85 %
Platelets: 244 10*3/uL (ref 150–400)
RBC: 4.16 MIL/uL — ABNORMAL LOW (ref 4.22–5.81)
RDW: 13.2 % (ref 11.5–15.5)
WBC: 23.8 10*3/uL — ABNORMAL HIGH (ref 4.0–10.5)
nRBC: 0 % (ref 0.0–0.2)

## 2022-12-19 LAB — COMPREHENSIVE METABOLIC PANEL
ALT: 52 U/L — ABNORMAL HIGH (ref 0–44)
AST: 17 U/L (ref 15–41)
Albumin: 2.2 g/dL — ABNORMAL LOW (ref 3.5–5.0)
Alkaline Phosphatase: 97 U/L (ref 38–126)
Anion gap: 11 (ref 5–15)
BUN: 24 mg/dL — ABNORMAL HIGH (ref 8–23)
CO2: 23 mmol/L (ref 22–32)
Calcium: 7.3 mg/dL — ABNORMAL LOW (ref 8.9–10.3)
Chloride: 102 mmol/L (ref 98–111)
Creatinine, Ser: 1.46 mg/dL — ABNORMAL HIGH (ref 0.61–1.24)
GFR, Estimated: 52 mL/min — ABNORMAL LOW (ref >60)
Glucose, Bld: 126 mg/dL — ABNORMAL HIGH (ref 70–99)
Potassium: 3.3 mmol/L — ABNORMAL LOW (ref 3.5–5.1)
Sodium: 136 mmol/L (ref 135–145)
Total Bilirubin: 0.4 mg/dL (ref 0.3–1.2)
Total Protein: 6 g/dL — ABNORMAL LOW (ref 6.5–8.1)

## 2022-12-19 LAB — PROCALCITONIN: Procalcitonin: >150 ng/mL

## 2022-12-19 LAB — URINE CULTURE: Culture: <10000 — AB

## 2022-12-19 LAB — MAGNESIUM: Magnesium: 2.6 mg/dL — ABNORMAL HIGH (ref 1.7–2.4)

## 2022-12-19 MED ORDER — FLUOXETINE HCL 20 MG PO CAPS: 20.0000 mg | ORAL_CAPSULE | Freq: Three times a day (TID) | ORAL | Status: DC

## 2022-12-19 MED ORDER — FLUOXETINE HCL 20 MG PO CAPS
40.0000 mg | ORAL_CAPSULE | Freq: Every day | ORAL | Status: DC
  Administered 2022-12-19 – 2022-12-22 (×4): 40 mg via ORAL
  Filled 2022-12-19 (×4): qty 2

## 2022-12-19 MED ORDER — FLUOXETINE HCL 20 MG PO CAPS
20.0000 mg | ORAL_CAPSULE | Freq: Every day | ORAL | Status: DC
  Administered 2022-12-19 – 2022-12-22 (×3): 20 mg via ORAL
  Filled 2022-12-19 (×3): qty 1

## 2022-12-19 MED ORDER — BUPROPION HCL ER (XL) 300 MG PO TB24
300.0000 mg | ORAL_TABLET | Freq: Every day | ORAL | Status: DC
  Administered 2022-12-19 – 2022-12-22 (×4): 300 mg via ORAL
  Filled 2022-12-19 (×4): qty 1

## 2022-12-19 MED ORDER — POTASSIUM CHLORIDE CRYS ER 20 MEQ PO TBCR
40.0000 meq | EXTENDED_RELEASE_TABLET | Freq: Once | ORAL | Status: AC
  Administered 2022-12-19: 40 meq via ORAL
  Filled 2022-12-19: qty 2

## 2022-12-19 MED ORDER — IPRATROPIUM-ALBUTEROL 0.5-2.5 (3) MG/3ML IN SOLN
3.0000 mL | Freq: Three times a day (TID) | RESPIRATORY_TRACT | Status: DC
  Administered 2022-12-19 – 2022-12-22 (×9): 3 mL via RESPIRATORY_TRACT
  Filled 2022-12-19 (×9): qty 3

## 2022-12-19 NOTE — Progress Notes (Addendum)
PROGRESS NOTE   Edward Jacobson  ZOX:[id number] DOB: [birth year] DOA: 12/18/2022 PCP: Caryl Bis, MD   Chief Complaint  Patient presents with   Shortness of Breath   Cough   Level of care: Med-Surg  Brief Admission History:   66 y/o male nonsmoker with hypogonadism, h/o nephrolithiasis, depression reported having progressive cough, chest congestion, weakness for last 10 days.  He reports sick contacts at home with viral illnesses.  Patient reported he had high fever at home up to 102 degrees.  He has had severe malaise symptoms.  He says he has not felt this bad in a very long time.  He is having a productive cough with yellow-green sputum production.  He denies chills.  He denies nausea vomiting diarrhea.  In the ED he tested positive for RSV.  He also had a chest x-ray with findings of left lower lobe consolidation and infiltrate.  He was febrile with a temperature of 101.4.  He was tachycardic as well.  He was started on sepsis protocol and admission requested for further management.   Assessment and Plan:  Sepsis secondary to step pneumonia - sepsis physiology resolving with supportive measures - continue aggressive treatments  Strep pneumonia bacteremia  - continue high dose IV ceftriaxone  - continue supportive measures - repeat BC on 12/24  Hypokalemia - repleted  AKI - improving but not resolved - continue IV hydration   LLL Pneumonia due to strep pneumonia - not back to baseline - continue IV ceftriaxone - DC azithromycin now that we have positive strep pneumo testing  Leukocytosis  - WBC remains elevated - recheck in AM - continue current treatments   Elevated LFTs - LFTs trending down with therapy   DVT prophylaxis: enoxaparin  Code Status: Full  Family Communication:  Disposition: Status is: Inpatient Remains inpatient appropriate because: IV antibiotics   Consultants:   Procedures:   Antimicrobials:  Ceftriaxone 2 g 12/22>> Subjective: Says  overall starting to feel better.  Objective: Vitals:   12/19/22 0152 12/19/22 0344 12/19/22 0552 12/19/22 0720  BP: (!) 114/100  131/74   Pulse: 97  94   Resp: 19  14   Temp: 98.1 F (36.7 C)  97.9 F (36.6 C)   TempSrc: Oral  Oral   SpO2: 95% 94% 96% 95%  Weight:      Height:        Intake/Output Summary (Last 24 hours) at 12/19/2022 1159 Last data filed at 12/18/2022 2015 Gross per 24 hour  Intake 1350 ml  Output --  Net 1350 ml   Filed Weights   12/18/22 1055 12/19/22 0100  Weight: 83.5 kg 88 kg   Examination:  General exam: Appears calm and comfortable  Respiratory system: dense LLL. Cardiovascular system: normal S1 & S2 heard. No JVD, murmurs, rubs, gallops or clicks. No pedal edema. Gastrointestinal system: Abdomen is nondistended, soft and nontender. No organomegaly or masses felt. Normal bowel sounds heard. Central nervous system: Alert and oriented. No focal neurological deficits. Extremities: Symmetric 5 x 5 power. Skin: No rashes, lesions or ulcers. Psychiatry: Judgement and insight appear normal. Mood & affect appropriate.   Data Reviewed: I have personally reviewed following labs and imaging studies  CBC: Recent Labs  Lab 12/18/22 1047 12/19/22 0612  WBC 23.7* 23.8*  NEUTROABS 21.6* 20.3*  HGB 15.6 12.8*  HCT 44.9 36.7*  MCV 88.2 88.2  PLT 265 811    Basic Metabolic Panel: Recent Labs  Lab 12/18/22 1047 12/18/22 1420 12/19/22 0612  NA 130*  --  136  K 3.3*  --  3.3*  CL 94*  --  102  CO2 25  --  23  GLUCOSE 125*  --  126*  BUN 25*  --  24*  CREATININE 1.72*  --  1.46*  CALCIUM 7.8*  --  7.3*  MG  --  2.3 2.6*    CBG: No results for input(s): "GLUCAP" in the last 168 hours.  Recent Results (from the past 240 hour(s))  Resp panel by RT-PCR (RSV, Flu A&B, Covid) Anterior Nasal Swab     Status: Abnormal   Collection Time: 12/18/22 10:35 AM   Specimen: Anterior Nasal Swab  Result Value Ref Range Status   SARS Coronavirus 2 by RT  PCR NEGATIVE NEGATIVE Final    Comment: (NOTE) SARS-CoV-2 target nucleic acids are NOT DETECTED.  The SARS-CoV-2 RNA is generally detectable in upper respiratory specimens during the acute phase of infection. The lowest concentration of SARS-CoV-2 viral copies this assay can detect is 138 copies/mL. A negative result does not preclude SARS-Cov-2 infection and should not be used as the sole basis for treatment or other patient management decisions. A negative result may occur with  improper specimen collection/handling, submission of specimen other than nasopharyngeal swab, presence of viral mutation(s) within the areas targeted by this assay, and inadequate number of viral copies(<138 copies/mL). A negative result must be combined with clinical observations, patient history, and epidemiological information. The expected result is Negative.  Fact Sheet for Patients:  EntrepreneurPulse.com.au  Fact Sheet for Healthcare Providers:  IncredibleEmployment.be  This test is no t yet approved or cleared by the [place] FDA and  has been authorized for detection and/or diagnosis of SARS-CoV-2 by FDA under an Emergency Use Authorization (EUA). This EUA will remain  in effect (meaning this test can be used) for the duration of the COVID-19 declaration under Section 564(b)(1) of the Act, 21 U.S.C.section 360bbb-3(b)(1), unless the authorization is terminated  or revoked sooner.       Influenza A by PCR NEGATIVE NEGATIVE Final   Influenza B by PCR NEGATIVE NEGATIVE Final    Comment: (NOTE) The Xpert Xpress SARS-CoV-2/FLU/RSV plus assay is intended as an aid in the diagnosis of influenza from Nasopharyngeal swab specimens and should not be used as a sole basis for treatment. Nasal washings and aspirates are unacceptable for Xpert Xpress SARS-CoV-2/FLU/RSV testing.  Fact Sheet for Patients: EntrepreneurPulse.com.au  Fact Sheet for  Healthcare Providers: IncredibleEmployment.be  This test is not yet approved or cleared by the [place] FDA and has been authorized for detection and/or diagnosis of SARS-CoV-2 by FDA under an Emergency Use Authorization (EUA). This EUA will remain in effect (meaning this test can be used) for the duration of the COVID-19 declaration under Section 564(b)(1) of the Act, 21 U.S.C. section 360bbb-3(b)(1), unless the authorization is terminated or revoked.     Resp Syncytial Virus by PCR POSITIVE (A) NEGATIVE Final    Comment: (NOTE) Fact Sheet for Patients: EntrepreneurPulse.com.au  Fact Sheet for Healthcare Providers: IncredibleEmployment.be  This test is not yet approved or cleared by the [place] FDA and has been authorized for detection and/or diagnosis of SARS-CoV-2 by FDA under an Emergency Use Authorization (EUA). This EUA will remain in effect (meaning this test can be used) for the duration of the COVID-19 declaration under Section 564(b)(1) of the Act, 21 U.S.C. section 360bbb-3(b)(1), unless the authorization is terminated or revoked.  Performed at [facility], [address], [place],  [place] [address]   Blood Culture (routine x 2)     Status: None (Preliminary result)   Collection Time: 12/18/22 10:47 AM   Specimen: BLOOD  Result Value Ref Range Status   Specimen Description   Final    BLOOD RIGHT ARM Performed at [facility], [address], [place], [place] [address]    Special Requests   Final    BOTTLES DRAWN AEROBIC AND ANAEROBIC Blood Culture adequate volume Performed at [facility], [address], [place], [place] [address]    Culture  Setup Time   Final    [place] Gram Stain Report Called to,Read Back By and Verified With: THOMPSON,R @ 8676 ON 12/18/22 BY JUW IN BOTH AEROBIC AND ANAEROBIC BOTTLES GS DONE @ [facility]  CRITICAL RESULT CALLED TO, READ BACK BY AND VERIFIED WITH: T EVANS,RN'@0412'$   12/19/22 [place] Performed at [facility], [place] [address] [place], [place] [address]    Culture GRAM POSITIVE COCCI  Final   Report Status PENDING  Incomplete  Blood Culture ID Panel (Reflexed)     Status: Abnormal   Collection Time: 12/18/22 10:47 AM  Result Value Ref Range Status   Enterococcus faecalis NOT DETECTED NOT DETECTED Final   Enterococcus Faecium NOT DETECTED NOT DETECTED Final   Listeria monocytogenes NOT DETECTED NOT DETECTED Final   Staphylococcus species NOT DETECTED NOT DETECTED Final   Staphylococcus aureus (BCID) NOT DETECTED NOT DETECTED Final   Staphylococcus epidermidis NOT DETECTED NOT DETECTED Final   Staphylococcus lugdunensis NOT DETECTED NOT DETECTED Final   Streptococcus species DETECTED (A) NOT DETECTED Final    Comment: CRITICAL RESULT CALLED TO, READ BACK BY AND VERIFIED WITH: T EVANS,RN'@0412'$  12/19/22 [place]    Streptococcus agalactiae NOT DETECTED NOT DETECTED Final   Streptococcus pneumoniae DETECTED (A) NOT DETECTED Final    Comment: CRITICAL RESULT CALLED TO, READ BACK BY AND VERIFIED WITH: T EVANS,RN'@0412'$  12/19/22 [place]    Streptococcus pyogenes NOT DETECTED NOT DETECTED Final   A.calcoaceticus-baumannii NOT DETECTED NOT DETECTED Final   Bacteroides fragilis NOT DETECTED NOT DETECTED Final   Enterobacterales NOT DETECTED NOT DETECTED Final   Enterobacter cloacae complex NOT DETECTED NOT DETECTED Final   Escherichia coli NOT DETECTED NOT DETECTED Final   Klebsiella aerogenes NOT DETECTED NOT DETECTED Final   Klebsiella oxytoca NOT DETECTED NOT DETECTED Final   Klebsiella pneumoniae NOT DETECTED NOT DETECTED Final   Proteus species NOT DETECTED NOT DETECTED Final   Salmonella species NOT DETECTED NOT DETECTED Final   Serratia marcescens NOT DETECTED NOT DETECTED Final   Haemophilus influenzae NOT DETECTED NOT DETECTED Final   Neisseria meningitidis NOT DETECTED NOT DETECTED Final   Pseudomonas aeruginosa NOT DETECTED NOT DETECTED Final    Stenotrophomonas maltophilia NOT DETECTED NOT DETECTED Final   Candida albicans NOT DETECTED NOT DETECTED Final   Candida auris NOT DETECTED NOT DETECTED Final   Candida glabrata NOT DETECTED NOT DETECTED Final   Candida krusei NOT DETECTED NOT DETECTED Final   Candida parapsilosis NOT DETECTED NOT DETECTED Final   Candida tropicalis NOT DETECTED NOT DETECTED Final   Cryptococcus neoformans/gattii NOT DETECTED NOT DETECTED Final    Comment: Performed at [facility], [address], [place], [place] [address]  Blood Culture (routine x 2)     Status: None (Preliminary result)   Collection Time: 12/18/22 11:00 AM   Specimen: BLOOD  Result Value Ref Range Status   Specimen Description BLOOD LEFT ASSIST CONTROL  Final   Special Requests  Final    BOTTLES DRAWN AEROBIC AND ANAEROBIC Blood Culture adequate volume   Culture   Final    NO GROWTH < 12 HOURS Performed at [facility], [address] [place], [place] [address]    Report Status PENDING  Incomplete     Radiology Studies: DG Chest Port 1 View  Result Date: 12/18/2022 CLINICAL DATA:  SOB cough EXAM: PORTABLE CHEST 1 VIEW COMPARISON:  08/18/2009 FINDINGS: The heart size and mediastinal contours are within normal limits. Left basilar opacity consistent with layering effusion. Left basilar consolidation. Right lung clear. Normal pulmonary vasculature. IMPRESSION: Left base consolidation and pleural effusion Electronically Signed   By: Sammie Bench [name]   On: 12/18/2022 10:47    Scheduled Meds:  acetaminophen  650 mg Oral Q4H   Or   acetaminophen  650 mg Rectal Q4H   buPROPion  300 mg Oral Daily   enoxaparin (LOVENOX) injection  40 mg Subcutaneous Q24H   FLUoxetine  20 mg Oral QHS   FLUoxetine  40 mg Oral Daily   guaiFENesin  1,200 mg Oral BID   ipratropium-albuterol  3 mL Nebulization TID   potassium chloride  40 mEq Oral Once   Continuous Infusions:  cefTRIAXone (ROCEPHIN)  IV 2 g (12/19/22 0920)     LOS: 1 day    Time spent: 18 mins  Jaiyana Canale Wynetta Emery, MD How to contact the [facility] Attending or Consulting provider [place] or covering provider during after hours [place], for this patient?  Check the care team in [facility] and look for a) attending/consulting [facility] provider listed and b) the [facility] team listed Log into [email or web address] and use [place]'s universal password to access. If you do not have the password, please contact the hospital operator. Locate the [facility] provider you are looking for under [facility] and page to a number that you can be directly reached. If you still have difficulty reaching the provider, please page the [facility] (Director on Call) for the Hospitalists listed on amion for assistance.  12/19/2022, 11:59 AM

## 2022-12-20 DIAGNOSIS — R7881 Bacteremia: Secondary | ICD-10-CM | POA: Diagnosis not present

## 2022-12-20 DIAGNOSIS — J189 Pneumonia, unspecified organism: Secondary | ICD-10-CM | POA: Diagnosis not present

## 2022-12-20 DIAGNOSIS — B338 Other specified viral diseases: Secondary | ICD-10-CM | POA: Diagnosis not present

## 2022-12-20 DIAGNOSIS — R7989 Other specified abnormal findings of blood chemistry: Secondary | ICD-10-CM | POA: Diagnosis not present

## 2022-12-20 DIAGNOSIS — D72829 Elevated white blood cell count, unspecified: Secondary | ICD-10-CM

## 2022-12-20 LAB — CBC WITH DIFFERENTIAL/PLATELET
Abs Immature Granulocytes: 0.62 10*3/uL — ABNORMAL HIGH (ref 0.00–0.07)
Basophils Absolute: 0.1 10*3/uL (ref 0.0–0.1)
Basophils Relative: 1 %
Eosinophils Absolute: 0 10*3/uL (ref 0.0–0.5)
Eosinophils Relative: 0 %
HCT: 38.4 % — ABNORMAL LOW (ref 39.0–52.0)
Hemoglobin: 13 g/dL (ref 13.0–17.0)
Immature Granulocytes: 4 %
Lymphocytes Relative: 7 %
Lymphs Abs: 1.3 10*3/uL (ref 0.7–4.0)
MCH: 30.2 pg (ref 26.0–34.0)
MCHC: 33.9 g/dL (ref 30.0–36.0)
MCV: 89.1 fL (ref 80.0–100.0)
Monocytes Absolute: 1 10*3/uL (ref 0.1–1.0)
Monocytes Relative: 5 %
Neutro Abs: 14.8 10*3/uL — ABNORMAL HIGH (ref 1.7–7.7)
Neutrophils Relative %: 83 %
Platelets: 293 10*3/uL (ref 150–400)
RBC: 4.31 MIL/uL (ref 4.22–5.81)
RDW: 13.1 % (ref 11.5–15.5)
WBC: 17.7 10*3/uL — ABNORMAL HIGH (ref 4.0–10.5)
nRBC: 0 % (ref 0.0–0.2)

## 2022-12-20 LAB — PROCALCITONIN: Procalcitonin: 72.34 ng/mL

## 2022-12-20 LAB — COMPREHENSIVE METABOLIC PANEL
ALT: 48 U/L — ABNORMAL HIGH (ref 0–44)
AST: 26 U/L (ref 15–41)
Albumin: 2.2 g/dL — ABNORMAL LOW (ref 3.5–5.0)
Alkaline Phosphatase: 87 U/L (ref 38–126)
Anion gap: 12 (ref 5–15)
BUN: 24 mg/dL — ABNORMAL HIGH (ref 8–23)
CO2: 22 mmol/L (ref 22–32)
Calcium: 7.4 mg/dL — ABNORMAL LOW (ref 8.9–10.3)
Chloride: 100 mmol/L (ref 98–111)
Creatinine, Ser: 1.33 mg/dL — ABNORMAL HIGH (ref 0.61–1.24)
GFR, Estimated: 59 mL/min — ABNORMAL LOW (ref >60)
Glucose, Bld: 82 mg/dL (ref 70–99)
Potassium: 3.4 mmol/L — ABNORMAL LOW (ref 3.5–5.1)
Sodium: 134 mmol/L — ABNORMAL LOW (ref 135–145)
Total Bilirubin: 0.7 mg/dL (ref 0.3–1.2)
Total Protein: 6.1 g/dL — ABNORMAL LOW (ref 6.5–8.1)

## 2022-12-20 MED ORDER — POTASSIUM CHLORIDE CRYS ER 20 MEQ PO TBCR
40.0000 meq | EXTENDED_RELEASE_TABLET | Freq: Once | ORAL | Status: AC
  Administered 2022-12-20: 40 meq via ORAL
  Filled 2022-12-20: qty 2

## 2022-12-20 NOTE — TOC Progression Note (Signed)
  Transition of Care [facility]) Screening Note   Patient Details  Name: Edward Jacobson Date of Birth: 12/22/1956   Transition of Care [facility]) CM/SW Contact:    Shade Flood, LCSW Phone Number: 12/20/2022, 9:03 AM    Transition of Care Department [facility]) has reviewed patient and no TOC needs have been identified at this time. We will continue to monitor patient advancement through interdisciplinary progression rounds. If new patient transition needs arise, please place a TOC consult.

## 2022-12-20 NOTE — Progress Notes (Signed)
Patient slept throughout this shift, no complaints of pain or discomfort at this time. 

## 2022-12-20 NOTE — Progress Notes (Signed)
PROGRESS NOTE   Edward Jacobson  DTO:[id number] DOB: [birth year] DOA: 12/18/2022 PCP: Caryl Bis, MD   Chief Complaint  Patient presents with   Shortness of Breath   Cough   Level of care: Med-Surg  Brief Admission History:   66 y/o male nonsmoker with hypogonadism, h/o nephrolithiasis, depression reported having progressive cough, chest congestion, weakness for last 10 days.  He reports sick contacts at home with viral illnesses.  Patient reported he had high fever at home up to 102 degrees.  He has had severe malaise symptoms.  He says he has not felt this bad in a very long time.  He is having a productive cough with yellow-green sputum production.  He denies chills.  He denies nausea vomiting diarrhea.  In the ED he tested positive for RSV.  He also had a chest x-ray with findings of left lower lobe consolidation and infiltrate.  He was febrile with a temperature of 101.4.  He was tachycardic as well.  He was started on sepsis protocol and admission requested for further management.   Assessment and Plan:  Sepsis secondary to step pneumonia - sepsis physiology resolving with supportive measures - continue aggressive treatments  Strep pneumonia bacteremia  - continue high dose IV ceftriaxone  - continue supportive measures - repeat BC on 12/24  Hypokalemia - give additional oral K today  AKI - improving but not resolved - continue IV hydration   LLL Pneumonia due to strep pneumonia - not back to baseline - continue IV ceftriaxone - DC azithromycin now that we have positive strep pneumo testing  Leukocytosis  - WBC remains elevated - recheck in AM - continue current treatments   Elevated LFTs - LFTs trending down with therapy   DVT prophylaxis: enoxaparin  Code Status: Full  Family Communication: plan of care discussed with patient at bedside  Disposition: Status is: Inpatient Remains inpatient appropriate because: IV antibiotics   Consultants:    Procedures:   Antimicrobials:  Ceftriaxone 2 g 12/22>>  Subjective: Not having chills or fever. Cough persists.     Objective: Vitals:   12/19/22 2007 12/19/22 2033 12/20/22 0346 12/20/22 0745  BP: (!) 146/88  139/86   Pulse: 84  87   Resp: 20  18   Temp: 97.7 F (36.5 C)  97.8 F (36.6 C)   TempSrc: Oral  Oral   SpO2: 94% 94% 96% 94%  Weight:      Height:        Intake/Output Summary (Last 24 hours) at 12/20/2022 0948 Last data filed at 12/19/2022 1807 Gross per 24 hour  Intake 480 ml  Output --  Net 480 ml   Filed Weights   12/18/22 1055 12/19/22 0100  Weight: 83.5 kg 88 kg   Examination:  General exam: Appears calm and comfortable  Respiratory system: dense LLL. Cardiovascular system: normal S1 & S2 heard. No JVD, murmurs, rubs, gallops or clicks. No pedal edema. Gastrointestinal system: Abdomen is nondistended, soft and nontender. No organomegaly or masses felt. Normal bowel sounds heard. Central nervous system: Alert and oriented. No focal neurological deficits. Extremities: Symmetric 5 x 5 power. Skin: No rashes, lesions or ulcers. Psychiatry: Judgement and insight appear normal. Mood & affect appropriate.   Data Reviewed: I have personally reviewed following labs and imaging studies  CBC: Recent Labs  Lab 12/18/22 1047 12/19/22 0612 12/20/22 0359  WBC 23.7* 23.8* 17.7*  NEUTROABS 21.6* 20.3* 14.8*  HGB 15.6 12.8* 13.0  HCT 44.9 36.7* 38.4*  MCV 88.2 88.2 89.1  PLT 265 244 833    Basic Metabolic Panel: Recent Labs  Lab 12/18/22 1047 12/18/22 1420 12/19/22 0612 12/20/22 0359  NA 130*  --  136 134*  K 3.3*  --  3.3* 3.4*  CL 94*  --  102 100  CO2 25  --  23 22  GLUCOSE 125*  --  126* 82  BUN 25*  --  24* 24*  CREATININE 1.72*  --  1.46* 1.33*  CALCIUM 7.8*  --  7.3* 7.4*  MG  --  2.3 2.6*  --     CBG: No results for input(s): "GLUCAP" in the last 168 hours.  Recent Results (from the past 240 hour(s))  Resp panel by RT-PCR  (RSV, Flu A&B, Covid) Anterior Nasal Swab     Status: Abnormal   Collection Time: 12/18/22 10:35 AM   Specimen: Anterior Nasal Swab  Result Value Ref Range Status   SARS Coronavirus 2 by RT PCR NEGATIVE NEGATIVE Final    Comment: (NOTE) SARS-CoV-2 target nucleic acids are NOT DETECTED.  The SARS-CoV-2 RNA is generally detectable in upper respiratory specimens during the acute phase of infection. The lowest concentration of SARS-CoV-2 viral copies this assay can detect is 138 copies/mL. A negative result does not preclude SARS-Cov-2 infection and should not be used as the sole basis for treatment or other patient management decisions. A negative result may occur with  improper specimen collection/handling, submission of specimen other than nasopharyngeal swab, presence of viral mutation(s) within the areas targeted by this assay, and inadequate number of viral copies(<138 copies/mL). A negative result must be combined with clinical observations, patient history, and epidemiological information. The expected result is Negative.  Fact Sheet for Patients:  EntrepreneurPulse.com.au  Fact Sheet for Healthcare Providers:  IncredibleEmployment.be  This test is no t yet approved or cleared by the [place] FDA and  has been authorized for detection and/or diagnosis of SARS-CoV-2 by FDA under an Emergency Use Authorization (EUA). This EUA will remain  in effect (meaning this test can be used) for the duration of the COVID-19 declaration under Section 564(b)(1) of the Act, 21 U.S.C.section 360bbb-3(b)(1), unless the authorization is terminated  or revoked sooner.       Influenza A by PCR NEGATIVE NEGATIVE Final   Influenza B by PCR NEGATIVE NEGATIVE Final    Comment: (NOTE) The Xpert Xpress SARS-CoV-2/FLU/RSV plus assay is intended as an aid in the diagnosis of influenza from Nasopharyngeal swab specimens and should not be used as a sole basis for  treatment. Nasal washings and aspirates are unacceptable for Xpert Xpress SARS-CoV-2/FLU/RSV testing.  Fact Sheet for Patients: EntrepreneurPulse.com.au  Fact Sheet for Healthcare Providers: IncredibleEmployment.be  This test is not yet approved or cleared by the [place] FDA and has been authorized for detection and/or diagnosis of SARS-CoV-2 by FDA under an Emergency Use Authorization (EUA). This EUA will remain in effect (meaning this test can be used) for the duration of the COVID-19 declaration under Section 564(b)(1) of the Act, 21 U.S.C. section 360bbb-3(b)(1), unless the authorization is terminated or revoked.     Resp Syncytial Virus by PCR POSITIVE (A) NEGATIVE Final    Comment: (NOTE) Fact Sheet for Patients: EntrepreneurPulse.com.au  Fact Sheet for Healthcare Providers: IncredibleEmployment.be  This test is not yet approved or cleared by the [place] FDA and has been authorized for detection and/or diagnosis of SARS-CoV-2 by FDA under an Emergency Use Authorization (EUA). This EUA will remain in effect (meaning  this test can be used) for the duration of the COVID-19 declaration under Section 564(b)(1) of the Act, 21 U.S.C. section 360bbb-3(b)(1), unless the authorization is terminated or revoked.  Performed at [facility], [address], [place], [place] [address]   Blood Culture (routine x 2)     Status: Abnormal (Preliminary result)   Collection Time: 12/18/22 10:47 AM   Specimen: BLOOD  Result Value Ref Range Status   Specimen Description   Final    BLOOD RIGHT ARM Performed at [facility], [address], [place], [place] [address]    Special Requests   Final    BOTTLES DRAWN AEROBIC AND ANAEROBIC Blood Culture adequate volume Performed at [facility], [address], [place], [place] [address]    Culture  Setup Time   Final    [place] Gram Stain Report Called to,Read Back  By and Verified With: THOMPSON,R @ 0093 ON 12/18/22 BY JUW IN BOTH AEROBIC AND ANAEROBIC BOTTLES GS DONE @ [facility]  CRITICAL RESULT CALLED TO, READ BACK BY AND VERIFIED WITH: T EVANS,RN'@0412'$  12/19/22 [place]    Culture (A)  Final    STREPTOCOCCUS PNEUMONIAE SUSCEPTIBILITIES TO FOLLOW Performed at [facility], [place] [address] [place], [place] [address]    Report Status PENDING  Incomplete  Blood Culture ID Panel (Reflexed)     Status: Abnormal   Collection Time: 12/18/22 10:47 AM  Result Value Ref Range Status   Enterococcus faecalis NOT DETECTED NOT DETECTED Final   Enterococcus Faecium NOT DETECTED NOT DETECTED Final   Listeria monocytogenes NOT DETECTED NOT DETECTED Final   Staphylococcus species NOT DETECTED NOT DETECTED Final   Staphylococcus aureus (BCID) NOT DETECTED NOT DETECTED Final   Staphylococcus epidermidis NOT DETECTED NOT DETECTED Final   Staphylococcus lugdunensis NOT DETECTED NOT DETECTED Final   Streptococcus species DETECTED (A) NOT DETECTED Final    Comment: CRITICAL RESULT CALLED TO, READ BACK BY AND VERIFIED WITH: T EVANS,RN'@0412'$  12/19/22 [place]    Streptococcus agalactiae NOT DETECTED NOT DETECTED Final   Streptococcus pneumoniae DETECTED (A) NOT DETECTED Final    Comment: CRITICAL RESULT CALLED TO, READ BACK BY AND VERIFIED WITH: T EVANS,RN'@0412'$  12/19/22 [place]    Streptococcus pyogenes NOT DETECTED NOT DETECTED Final   A.calcoaceticus-baumannii NOT DETECTED NOT DETECTED Final   Bacteroides fragilis NOT DETECTED NOT DETECTED Final   Enterobacterales NOT DETECTED NOT DETECTED Final   Enterobacter cloacae complex NOT DETECTED NOT DETECTED Final   Escherichia coli NOT DETECTED NOT DETECTED Final   Klebsiella aerogenes NOT DETECTED NOT DETECTED Final   Klebsiella oxytoca NOT DETECTED NOT DETECTED Final   Klebsiella pneumoniae NOT DETECTED NOT DETECTED Final   Proteus species NOT DETECTED NOT DETECTED Final   Salmonella species NOT DETECTED NOT DETECTED Final    Serratia marcescens NOT DETECTED NOT DETECTED Final   Haemophilus influenzae NOT DETECTED NOT DETECTED Final   Neisseria meningitidis NOT DETECTED NOT DETECTED Final   Pseudomonas aeruginosa NOT DETECTED NOT DETECTED Final   Stenotrophomonas maltophilia NOT DETECTED NOT DETECTED Final   Candida albicans NOT DETECTED NOT DETECTED Final   Candida auris NOT DETECTED NOT DETECTED Final   Candida glabrata NOT DETECTED NOT DETECTED Final   Candida krusei NOT DETECTED NOT DETECTED Final   Candida parapsilosis NOT DETECTED NOT DETECTED Final   Candida tropicalis NOT DETECTED NOT DETECTED Final   Cryptococcus neoformans/gattii NOT DETECTED NOT DETECTED Final    Comment: Performed at [facility], [address] [place],  [address]  Blood Culture (routine x 2)     Status: None (Preliminary result)   Collection Time: 12/18/22 11:00 AM   Specimen: BLOOD  Result Value Ref Range Status   Specimen Description BLOOD LEFT ASSIST CONTROL  Final   Special Requests   Final    BOTTLES DRAWN AEROBIC AND ANAEROBIC Blood Culture adequate volume   Culture   Final    NO GROWTH 2 DAYS Performed at [facility], [address] [place], [place] [address]    Report Status PENDING  Incomplete  Urine Culture     Status: Abnormal   Collection Time: 12/18/22 12:25 PM   Specimen: Urine, Catheterized  Result Value Ref Range Status   Specimen Description   Final    URINE, CATHETERIZED Performed at [facility], [address] [place], [place] [address]    Special Requests   Final    NONE Performed at [facility], [address] [place], [place] [address]    Culture (A)  Final    <10,000 COLONIES/mL INSIGNIFICANT GROWTH Performed at [facility], [place] [address], [place], [place] [address]    Report Status 12/19/2022 FINAL  Final     Radiology Studies: DG Chest Port 1 View  Result Date: 12/18/2022 CLINICAL DATA:  SOB cough EXAM: PORTABLE CHEST 1 VIEW COMPARISON:  08/18/2009 FINDINGS:  The heart size and mediastinal contours are within normal limits. Left basilar opacity consistent with layering effusion. Left basilar consolidation. Right lung clear. Normal pulmonary vasculature. IMPRESSION: Left base consolidation and pleural effusion Electronically Signed   By: Sammie Bench [name]   On: 12/18/2022 10:47    Scheduled Meds:  acetaminophen  650 mg Oral Q4H   Or   acetaminophen  650 mg Rectal Q4H   buPROPion  300 mg Oral Daily   enoxaparin (LOVENOX) injection  40 mg Subcutaneous Q24H   FLUoxetine  20 mg Oral QHS   FLUoxetine  40 mg Oral Daily   guaiFENesin  1,200 mg Oral BID   ipratropium-albuterol  3 mL Nebulization TID   potassium chloride  40 mEq Oral Once   Continuous Infusions:  cefTRIAXone (ROCEPHIN)  IV 2 g (12/19/22 0920)    LOS: 2 days   Time spent: 36 mins  Jerrie Schussler Wynetta Emery, MD How to contact the [facility] Attending or Consulting provider [place] or covering provider during after hours [place], for this patient?  Check the care team in [facility] and look for a) attending/consulting [facility] provider listed and b) the [facility] team listed Log into [email or web address] and use [place]'s universal password to access. If you do not have the password, please contact the hospital operator. Locate the [facility] provider you are looking for under [facility] and page to a number that you can be directly reached. If you still have difficulty reaching the provider, please page the [facility] (Director on Call) for the Hospitalists listed on amion for assistance.  12/20/2022, 9:48 AM

## 2022-12-20 NOTE — Progress Notes (Signed)
Patient reported that he had blown his nose an noted some small blood clots. Informed patient if it happened again to inform this Probation officer for me to assess. MD Wynetta Emery made aware. No new orders.

## 2022-12-21 DIAGNOSIS — R7881 Bacteremia: Secondary | ICD-10-CM | POA: Diagnosis not present

## 2022-12-21 DIAGNOSIS — B338 Other specified viral diseases: Secondary | ICD-10-CM | POA: Diagnosis not present

## 2022-12-21 DIAGNOSIS — J189 Pneumonia, unspecified organism: Secondary | ICD-10-CM | POA: Diagnosis not present

## 2022-12-21 DIAGNOSIS — R7989 Other specified abnormal findings of blood chemistry: Secondary | ICD-10-CM | POA: Diagnosis not present

## 2022-12-21 LAB — COMPREHENSIVE METABOLIC PANEL
ALT: 59 U/L — ABNORMAL HIGH (ref 0–44)
AST: 33 U/L (ref 15–41)
Albumin: 2.3 g/dL — ABNORMAL LOW (ref 3.5–5.0)
Alkaline Phosphatase: 69 U/L (ref 38–126)
Anion gap: 8 (ref 5–15)
BUN: 18 mg/dL (ref 8–23)
CO2: 24 mmol/L (ref 22–32)
Calcium: 7.7 mg/dL — ABNORMAL LOW (ref 8.9–10.3)
Chloride: 104 mmol/L (ref 98–111)
Creatinine, Ser: 1.27 mg/dL — ABNORMAL HIGH (ref 0.61–1.24)
GFR, Estimated: >60 mL/min (ref >60)
Glucose, Bld: 90 mg/dL (ref 70–99)
Potassium: 3.8 mmol/L (ref 3.5–5.1)
Sodium: 136 mmol/L (ref 135–145)
Total Bilirubin: 0.3 mg/dL (ref 0.3–1.2)
Total Protein: 5.9 g/dL — ABNORMAL LOW (ref 6.5–8.1)

## 2022-12-21 LAB — CBC WITH DIFFERENTIAL/PLATELET
Abs Immature Granulocytes: 0.3 10*3/uL — ABNORMAL HIGH (ref 0.00–0.07)
Basophils Absolute: 0.1 10*3/uL (ref 0.0–0.1)
Basophils Relative: 1 %
Eosinophils Absolute: 0.1 10*3/uL (ref 0.0–0.5)
Eosinophils Relative: 1 %
HCT: 39 % (ref 39.0–52.0)
Hemoglobin: 13.2 g/dL (ref 13.0–17.0)
Immature Granulocytes: 3 %
Lymphocytes Relative: 14 %
Lymphs Abs: 1.3 10*3/uL (ref 0.7–4.0)
MCH: 30.3 pg (ref 26.0–34.0)
MCHC: 33.8 g/dL (ref 30.0–36.0)
MCV: 89.4 fL (ref 80.0–100.0)
Monocytes Absolute: 0.7 10*3/uL (ref 0.1–1.0)
Monocytes Relative: 7 %
Neutro Abs: 6.9 10*3/uL (ref 1.7–7.7)
Neutrophils Relative %: 74 %
Platelets: 322 10*3/uL (ref 150–400)
RBC: 4.36 MIL/uL (ref 4.22–5.81)
RDW: 13.2 % (ref 11.5–15.5)
WBC: 9.3 10*3/uL (ref 4.0–10.5)
nRBC: 0 % (ref 0.0–0.2)

## 2022-12-21 LAB — CULTURE, BLOOD (ROUTINE X 2): Special Requests: ADEQUATE

## 2022-12-21 NOTE — Progress Notes (Signed)
Patient refused to walk this evening

## 2022-12-21 NOTE — Progress Notes (Signed)
PROGRESS NOTE   Edward Jacobson  ZOX:[id number] DOB: May 25, 1956 DOA: 12/18/2022 PCP: Caryl Bis, MD   Chief Complaint  Patient presents with   Shortness of Breath   Cough   Level of care: Med-Surg  Brief Admission History:   66 y/o male nonsmoker with hypogonadism, h/o nephrolithiasis, depression reported having progressive cough, chest congestion, weakness for last 10 days.  He reports sick contacts at home with viral illnesses.  Patient reported he had high fever at home up to 102 degrees.  He has had severe malaise symptoms.  He says he has not felt this bad in a very long time.  He is having a productive cough with yellow-green sputum production.  He denies chills.  He denies nausea vomiting diarrhea.  In the ED he tested positive for RSV.  He also had a chest x-ray with findings of left lower lobe consolidation and infiltrate.  He was febrile with a temperature of 101.4.  He was tachycardic as well.  He was started on sepsis protocol and admission requested for further management.   Assessment and Plan:  Sepsis secondary to step pneumonia - sepsis physiology resolving with supportive measures - continue aggressive treatments  Strep pneumonia bacteremia  - continue high dose IV ceftriaxone  - continue supportive measures - repeat BC on 12/24 - no growth to date   Hypokalemia - repleted   AKI - improving but not resolved - continue IV hydration   LLL Pneumonia due to strep pneumonia - not back to baseline - continue IV ceftriaxone - DC azithromycin now that we have positive strep pneumo testing  Leukocytosis  - resolved  - continue current treatments   Elevated LFTs - LFTs trending down with therapy   DVT prophylaxis: enoxaparin  Code Status: Full  Family Communication: plan of care discussed with patient at bedside  Disposition: Status is: Inpatient Remains inpatient appropriate because: IV antibiotics   Consultants:   Procedures:   Antimicrobials:   Ceftriaxone 2 g 12/22>>  Subjective: Pt getting cabin fever in room, wanting to ambulate halls, otherwise feeling better.      Objective: Vitals:   12/20/22 2057 12/20/22 2203 12/21/22 0418 12/21/22 0800  BP:  (!) 140/82 (!) 151/84   Pulse:  86 75   Resp:  18 20   Temp:  98.7 F (37.1 C) 98.2 F (36.8 C)   TempSrc:  Oral    SpO2: 92% 96% 96% 91%  Weight:      Height:        Intake/Output Summary (Last 24 hours) at 12/21/2022 1154 Last data filed at 12/21/2022 0949 Gross per 24 hour  Intake 1080 ml  Output 2 ml  Net 1078 ml   Filed Weights   12/18/22 1055 12/19/22 0100  Weight: 83.5 kg 88 kg   Examination:  General exam: Appears calm and comfortable  Respiratory system: dense LLL. Cardiovascular system: normal S1 & S2 heard. No JVD, murmurs, rubs, gallops or clicks. No pedal edema. Gastrointestinal system: Abdomen is nondistended, soft and nontender. No organomegaly or masses felt. Normal bowel sounds heard. Central nervous system: Alert and oriented. No focal neurological deficits. Extremities: Symmetric 5 x 5 power. Skin: No rashes, lesions or ulcers. Psychiatry: Judgement and insight appear normal. Mood & affect appropriate.   Data Reviewed: I have personally reviewed following labs and imaging studies  CBC: Recent Labs  Lab 12/18/22 1047 12/19/22 0612 12/20/22 0359 12/21/22 0449  WBC 23.7* 23.8* 17.7* 9.3  NEUTROABS 21.6* 20.3* 14.8* 6.9  HGB 15.6 12.8* 13.0 13.2  HCT 44.9 36.7* 38.4* 39.0  MCV 88.2 88.2 89.1 89.4  PLT 265 244 293 892    Basic Metabolic Panel: Recent Labs  Lab 12/18/22 1047 12/18/22 1420 12/19/22 0612 12/20/22 0359 12/21/22 0449  NA 130*  --  136 134* 136  K 3.3*  --  3.3* 3.4* 3.8  CL 94*  --  102 100 104  CO2 25  --  '23 22 24  '$ GLUCOSE 125*  --  126* 82 90  BUN 25*  --  24* 24* 18  CREATININE 1.72*  --  1.46* 1.33* 1.27*  CALCIUM 7.8*  --  7.3* 7.4* 7.7*  MG  --  2.3 2.6*  --   --     CBG: No results for input(s):  "GLUCAP" in the last 168 hours.  Recent Results (from the past 240 hour(s))  Resp panel by RT-PCR (RSV, Flu A&B, Covid) Anterior Nasal Swab     Status: Abnormal   Collection Time: 12/18/22 10:35 AM   Specimen: Anterior Nasal Swab  Result Value Ref Range Status   SARS Coronavirus 2 by RT PCR NEGATIVE NEGATIVE Final    Comment: (NOTE) SARS-CoV-2 target nucleic acids are NOT DETECTED.  The SARS-CoV-2 RNA is generally detectable in upper respiratory specimens during the acute phase of infection. The lowest concentration of SARS-CoV-2 viral copies this assay can detect is 138 copies/mL. A negative result does not preclude SARS-Cov-2 infection and should not be used as the sole basis for treatment or other patient management decisions. A negative result may occur with  improper specimen collection/handling, submission of specimen other than nasopharyngeal swab, presence of viral mutation(s) within the areas targeted by this assay, and inadequate number of viral copies(<138 copies/mL). A negative result must be combined with clinical observations, patient history, and epidemiological information. The expected result is Negative.  Fact Sheet for Patients:  EntrepreneurPulse.com.au  Fact Sheet for Healthcare Providers:  IncredibleEmployment.be  This test is no t yet approved or cleared by the [place] FDA and  has been authorized for detection and/or diagnosis of SARS-CoV-2 by FDA under an Emergency Use Authorization (EUA). This EUA will remain  in effect (meaning this test can be used) for the duration of the COVID-19 declaration under Section 564(b)(1) of the Act, 21 U.S.C.section 360bbb-3(b)(1), unless the authorization is terminated  or revoked sooner.       Influenza A by PCR NEGATIVE NEGATIVE Final   Influenza B by PCR NEGATIVE NEGATIVE Final    Comment: (NOTE) The Xpert Xpress SARS-CoV-2/FLU/RSV plus assay is intended as an aid in the  diagnosis of influenza from Nasopharyngeal swab specimens and should not be used as a sole basis for treatment. Nasal washings and aspirates are unacceptable for Xpert Xpress SARS-CoV-2/FLU/RSV testing.  Fact Sheet for Patients: EntrepreneurPulse.com.au  Fact Sheet for Healthcare Providers: IncredibleEmployment.be  This test is not yet approved or cleared by the [place] FDA and has been authorized for detection and/or diagnosis of SARS-CoV-2 by FDA under an Emergency Use Authorization (EUA). This EUA will remain in effect (meaning this test can be used) for the duration of the COVID-19 declaration under Section 564(b)(1) of the Act, 21 U.S.C. section 360bbb-3(b)(1), unless the authorization is terminated or revoked.     Resp Syncytial Virus by PCR POSITIVE (A) NEGATIVE Final    Comment: (NOTE) Fact Sheet for Patients: EntrepreneurPulse.com.au  Fact Sheet for Healthcare Providers: IncredibleEmployment.be  This test is not yet approved or cleared by the [place]  States FDA and has been authorized for detection and/or diagnosis of SARS-CoV-2 by FDA under an Emergency Use Authorization (EUA). This EUA will remain in effect (meaning this test can be used) for the duration of the COVID-19 declaration under Section 564(b)(1) of the Act, 21 U.S.C. section 360bbb-3(b)(1), unless the authorization is terminated or revoked.  Performed at [facility], [address], [place], [place] [address]   Blood Culture (routine x 2)     Status: Abnormal (Preliminary result)   Collection Time: 12/18/22 10:47 AM   Specimen: BLOOD  Result Value Ref Range Status   Specimen Description   Final    BLOOD RIGHT ARM Performed at [facility], [address], [place], [place] [address]    Special Requests   Final    BOTTLES DRAWN AEROBIC AND ANAEROBIC Blood Culture adequate volume Performed at [facility], [address],  [place], [place] [address]    Culture  Setup Time   Final    [place] Gram Stain Report Called to,Read Back By and Verified With: THOMPSON,R @ 2202 ON 12/18/22 BY JUW IN BOTH AEROBIC AND ANAEROBIC BOTTLES GS DONE @ [facility]  CRITICAL RESULT CALLED TO, READ BACK BY AND VERIFIED WITH: T EVANS,RN'@0412'$  12/19/22 [place]    Culture (A)  Final    STREPTOCOCCUS PNEUMONIAE SUSCEPTIBILITIES TO FOLLOW Performed at [facility], [place] [address], [place], [place] [address]    Report Status PENDING  Incomplete  Blood Culture ID Panel (Reflexed)     Status: Abnormal   Collection Time: 12/18/22 10:47 AM  Result Value Ref Range Status   Enterococcus faecalis NOT DETECTED NOT DETECTED Final   Enterococcus Faecium NOT DETECTED NOT DETECTED Final   Listeria monocytogenes NOT DETECTED NOT DETECTED Final   Staphylococcus species NOT DETECTED NOT DETECTED Final   Staphylococcus aureus (BCID) NOT DETECTED NOT DETECTED Final   Staphylococcus epidermidis NOT DETECTED NOT DETECTED Final   Staphylococcus lugdunensis NOT DETECTED NOT DETECTED Final   Streptococcus species DETECTED (A) NOT DETECTED Final    Comment: CRITICAL RESULT CALLED TO, READ BACK BY AND VERIFIED WITH: T EVANS,RN'@0412'$  12/19/22 [place]    Streptococcus agalactiae NOT DETECTED NOT DETECTED Final   Streptococcus pneumoniae DETECTED (A) NOT DETECTED Final    Comment: CRITICAL RESULT CALLED TO, READ BACK BY AND VERIFIED WITH: T EVANS,RN'@0412'$  12/19/22 [place]    Streptococcus pyogenes NOT DETECTED NOT DETECTED Final   A.calcoaceticus-baumannii NOT DETECTED NOT DETECTED Final   Bacteroides fragilis NOT DETECTED NOT DETECTED Final   Enterobacterales NOT DETECTED NOT DETECTED Final   Enterobacter cloacae complex NOT DETECTED NOT DETECTED Final   Escherichia coli NOT DETECTED NOT DETECTED Final   Klebsiella aerogenes NOT DETECTED NOT DETECTED Final   Klebsiella oxytoca NOT DETECTED NOT DETECTED Final   Klebsiella pneumoniae NOT DETECTED NOT DETECTED Final   Proteus  species NOT DETECTED NOT DETECTED Final   Salmonella species NOT DETECTED NOT DETECTED Final   Serratia marcescens NOT DETECTED NOT DETECTED Final   Haemophilus influenzae NOT DETECTED NOT DETECTED Final   Neisseria meningitidis NOT DETECTED NOT DETECTED Final   Pseudomonas aeruginosa NOT DETECTED NOT DETECTED Final   Stenotrophomonas maltophilia NOT DETECTED NOT DETECTED Final   Candida albicans NOT DETECTED NOT DETECTED Final   Candida auris NOT DETECTED NOT DETECTED Final   Candida glabrata NOT DETECTED NOT DETECTED Final   Candida krusei NOT DETECTED NOT DETECTED Final   Candida parapsilosis NOT DETECTED NOT DETECTED Final   Candida tropicalis NOT DETECTED NOT DETECTED Final  Cryptococcus neoformans/gattii NOT DETECTED NOT DETECTED Final    Comment: Performed at [facility], [place] [address] [place], [place] [address]  Blood Culture (routine x 2)     Status: None (Preliminary result)   Collection Time: 12/18/22 11:00 AM   Specimen: BLOOD  Result Value Ref Range Status   Specimen Description BLOOD LEFT ASSIST CONTROL  Final   Special Requests   Final    BOTTLES DRAWN AEROBIC AND ANAEROBIC Blood Culture adequate volume   Culture   Final    NO GROWTH 3 DAYS Performed at [facility], [address], [place], [place] [address]    Report Status PENDING  Incomplete  Urine Culture     Status: Abnormal   Collection Time: 12/18/22 12:25 PM   Specimen: Urine, Catheterized  Result Value Ref Range Status   Specimen Description   Final    URINE, CATHETERIZED Performed at [facility], [address] [place], [place] [address]    Special Requests   Final    NONE Performed at [facility], [address] [place], [place] [address]    Culture (A)  Final    <10,000 COLONIES/mL INSIGNIFICANT GROWTH Performed at [facility], [place] [address], [place], [place] [address]    Report Status 12/19/2022 FINAL  Final  Culture, blood (Routine X 2) w Reflex to ID Panel     Status: None  (Preliminary result)   Collection Time: 12/20/22 10:09 AM   Specimen: BLOOD LEFT ARM  Result Value Ref Range Status   Specimen Description BLOOD LEFT ARM  Final   Special Requests   Final    BOTTLES DRAWN AEROBIC AND ANAEROBIC Blood Culture results may not be optimal due to an excessive volume of blood received in culture bottles   Culture   Final    NO GROWTH < 24 HOURS Performed at [facility], [address] [place], [place] [address]    Report Status PENDING  Incomplete  Culture, blood (Routine X 2) w Reflex to ID Panel     Status: None (Preliminary result)   Collection Time: 12/20/22 10:09 AM   Specimen: BLOOD RIGHT ARM  Result Value Ref Range Status   Specimen Description BLOOD RIGHT ARM  Final   Special Requests   Final    BOTTLES DRAWN AEROBIC AND ANAEROBIC Blood Culture results may not be optimal due to an excessive volume of blood received in culture bottles   Culture   Final    NO GROWTH < 24 HOURS Performed at [facility], [address] [place], [place] [address]    Report Status PENDING  Incomplete     Radiology Studies: No results found.  Scheduled Meds:  acetaminophen  650 mg Oral Q4H   Or   acetaminophen  650 mg Rectal Q4H   buPROPion  300 mg Oral Daily   enoxaparin (LOVENOX) injection  40 mg Subcutaneous Q24H   FLUoxetine  20 mg Oral QHS   FLUoxetine  40 mg Oral Daily   guaiFENesin  1,200 mg Oral BID   ipratropium-albuterol  3 mL Nebulization TID   Continuous Infusions:  cefTRIAXone (ROCEPHIN)  IV 2 g (12/21/22 0931)    LOS: 3 days   Time spent: 35 mins  Donaven Criswell Wynetta Emery, MD How to contact the [facility] Attending or Consulting provider [place] or covering provider during after hours [place], for this patient?  Check the care team in [facility] and look for a) attending/consulting [place] provider listed and  b) the [facility] team listed Log into [email or web address] and use [place]'s universal password to access. If you do not have the password, please contact the hospital  operator. Locate the [facility] provider you are looking for under [facility] and page to a number that you can be directly reached. If you still have difficulty reaching the provider, please page the [facility] (Director on Call) for the Hospitalists listed on amion for assistance.  12/21/2022, 11:54 AM

## 2022-12-22 DIAGNOSIS — J189 Pneumonia, unspecified organism: Secondary | ICD-10-CM | POA: Diagnosis not present

## 2022-12-22 DIAGNOSIS — B338 Other specified viral diseases: Secondary | ICD-10-CM | POA: Diagnosis not present

## 2022-12-22 DIAGNOSIS — D72829 Elevated white blood cell count, unspecified: Secondary | ICD-10-CM | POA: Diagnosis not present

## 2022-12-22 DIAGNOSIS — R7881 Bacteremia: Secondary | ICD-10-CM | POA: Diagnosis not present

## 2022-12-22 LAB — BASIC METABOLIC PANEL
Anion gap: 8 (ref 5–15)
BUN: 17 mg/dL (ref 8–23)
CO2: 24 mmol/L (ref 22–32)
Calcium: 8 mg/dL — ABNORMAL LOW (ref 8.9–10.3)
Chloride: 103 mmol/L (ref 98–111)
Creatinine, Ser: 1.29 mg/dL — ABNORMAL HIGH (ref 0.61–1.24)
GFR, Estimated: >60 mL/min (ref >60)
Glucose, Bld: 97 mg/dL (ref 70–99)
Potassium: 4 mmol/L (ref 3.5–5.1)
Sodium: 135 mmol/L (ref 135–145)

## 2022-12-22 LAB — MAGNESIUM: Magnesium: 2.2 mg/dL (ref 1.7–2.4)

## 2022-12-22 MED ORDER — IPRATROPIUM-ALBUTEROL 0.5-2.5 (3) MG/3ML IN SOLN: 3.0000 mL | Freq: Two times a day (BID) | RESPIRATORY_TRACT | Status: DC

## 2022-12-22 MED ORDER — DEXTROMETHORPHAN POLISTIREX ER 30 MG/5ML PO SUER: 30.0000 mg | Freq: Two times a day (BID) | ORAL | 0 refills | Status: AC | PRN

## 2022-12-22 MED ORDER — CEFDINIR 300 MG PO CAPS: 300.0000 mg | ORAL_CAPSULE | Freq: Two times a day (BID) | ORAL | 0 refills | Status: AC

## 2022-12-22 MED ORDER — GUAIFENESIN ER 600 MG PO TB12: 1200.0000 mg | ORAL_TABLET | Freq: Two times a day (BID) | ORAL | 0 refills | Status: AC

## 2022-12-22 NOTE — Progress Notes (Signed)
Ng Discharge Note  Admit Date:  12/18/2022 Discharge date: 12/22/2022   Edward Jacobson to be D/C'd Home per MD order.  AVS completed. Patient/caregiver able to verbalize understanding.  Discharge Medication: Allergies as of 12/22/2022   No Known Allergies      Medication List     TAKE these medications    amphetamine-dextroamphetamine 10 MG tablet Commonly known as: ADDERALL Take 10 mg by mouth 2 (two) times daily.   buPROPion 300 MG 24 hr tablet Commonly known as: WELLBUTRIN XL Take 300 mg by mouth daily.   cefdinir 300 MG capsule Commonly known as: OMNICEF Take 1 capsule (300 mg total) by mouth 2 (two) times daily for 7 days. Start taking on: December 23, 2022   CREATINE PO Take 5 g by mouth daily.   dextromethorphan 30 MG/5ML liquid Commonly known as: DELSYM Take 5 mLs (30 mg total) by mouth 2 (two) times daily as needed for cough.   FLUoxetine 20 MG capsule Commonly known as: PROZAC Take 20 mg by mouth 3 (three) times daily. Patient takes '40mg'$  in the morning and '20mg'$  at night   GINSENG PO Take 300 mg by mouth daily.   guaiFENesin 600 MG 12 hr tablet Commonly known as: MUCINEX Take 2 tablets (1,200 mg total) by mouth 2 (two) times daily for 2 days.   L-ARGININE PO Take 3,000-5,000 mg by mouth daily.   multivitamin with minerals Tabs tablet Take 1 tablet by mouth daily.   PROTEIN PO Take 20 g by mouth daily.   testosterone cypionate 200 MG/ML injection Commonly known as: DEPOTESTOSTERONE CYPIONATE Inject 100 mg into the muscle every 14 (fourteen) days.        Discharge Assessment: Vitals:   12/22/22 0430 12/22/22 0744  BP: (!) 141/87   Pulse: 78   Resp: 10   Temp: 98.1 F (36.7 C)   SpO2: 97% 96%   Skin clean, dry and intact without evidence of skin break down, no evidence of skin tears noted. IV catheter discontinued intact. Site without signs and symptoms of complications - no redness or edema noted at insertion site, patient denies c/o  pain - only slight tenderness at site.  Dressing with slight pressure applied.  D/c Instructions-Education: Discharge instructions given to patient/family with verbalized understanding. D/c education completed with patient/family including follow up instructions, medication list, d/c activities limitations if indicated, with other d/c instructions as indicated by MD - patient able to verbalize understanding, all questions fully answered. Patient instructed to return to ED, call 911, or call MD for any changes in condition.  Patient escorted via [place], and D/C home via private auto.  Tsosie Billing, LPN 66/29/4765 46:50 AM

## 2022-12-22 NOTE — Discharge Instructions (Signed)
Please have a chest xray done in 4 weeks to confirm resolution of pneumonia   IMPORTANT INFORMATION: PAY CLOSE ATTENTION   PHYSICIAN DISCHARGE INSTRUCTIONS  Follow with Primary care provider  Caryl Bis, MD  and other consultants as instructed by your Hospitalist Physician  [place] IF SYMPTOMS COME BACK, WORSEN OR NEW PROBLEM DEVELOPS   Please note: You were cared for by a hospitalist during your hospital stay. Every effort will be made to forward records to your primary care provider.  You can request that your primary care provider send for your hospital records if they have not received them.  Once you are discharged, your primary care physician will handle any further medical issues. Please note that NO REFILLS for any discharge medications will be authorized once you are discharged, as it is imperative that you return to your primary care physician (or establish a relationship with a primary care physician if you do not have one) for your post hospital discharge needs so that they can reassess your need for medications and monitor your lab values.  Please get a complete blood count and chemistry panel checked by your Primary MD at your next visit, and again as instructed by your Primary MD.  Get Medicines reviewed and adjusted: Please take all your medications with you for your next visit with your Primary MD  Laboratory/radiological data: Please request your Primary MD to go over all hospital tests and procedure/radiological results at the follow up, please ask your primary care provider to get all Hospital records sent to his/her office.  In some cases, they will be blood work, cultures and biopsy results pending at the time of your discharge. Please request that your primary care provider follow up on these results.  If you are diabetic, please bring your blood sugar readings with you to your follow up appointment with primary care.     Please call and make your follow up appointments as soon as possible.    Also Note the following: If you experience worsening of your admission symptoms, develop shortness of breath, life threatening emergency, suicidal or homicidal thoughts you must seek medical attention immediately by calling 911 or calling your MD immediately  if symptoms less severe.  You must read complete instructions/literature along with all the possible adverse reactions/side effects for all the Medicines you take and that have been prescribed to you. Take any new Medicines after you have completely understood and accpet all the possible adverse reactions/side effects.   Do not drive when taking Pain medications or sleeping medications (Benzodiazepines)  Do not take more than prescribed Pain, Sleep and Anxiety Medications. It is not advisable to combine anxiety,sleep and pain medications without talking with your primary care practitioner  Special Instructions: If you have smoked or chewed Tobacco  in the last 2 yrs please stop smoking, stop any regular Alcohol  and or any Recreational drug use.  Wear Seat belts while driving.  Do not drive if taking any narcotic, mind altering or controlled substances or recreational drugs or alcohol.

## 2022-12-22 NOTE — Discharge Summary (Signed)
Physician Discharge Summary  Edward Jacobson MVH:[id number] DOB: 1956/07/06 DOA: 12/18/2022  PCP: Caryl Bis, MD  Admit date: 12/18/2022 Discharge date: 12/22/2022  Admitted From:  Home  Disposition: Home   Recommendations for Outpatient Follow-up:  Follow up with PCP in 1 weeks Please follow up with a CXR in 4 weeks to ensure resolution of pneumonia Please consider pneumonia vaccine when he has recovered from this illness  Please follow up final cultures   Discharge Condition: STABLE   CODE STATUS: FULL DIET: resume prior home diet    Brief Hospitalization Summary: Please see all hospital notes, images, labs for full details of the hospitalization. 66 y/o male nonsmoker with hypogonadism, h/o nephrolithiasis, depression reported having progressive cough, chest congestion, weakness for last 10 days.  He reports sick contacts at home with viral illnesses.  Patient reported he had high fever at home up to 102 degrees.  He has had severe malaise symptoms.  He says he has not felt this bad in a very long time.  He is having a productive cough with yellow-green sputum production.  He denies chills.  He denies nausea vomiting diarrhea.   In the ED he tested positive for RSV.  He also had a chest x-ray with findings of left lower lobe consolidation and infiltrate.  He was febrile with a temperature of 101.4.  He was tachycardic as well.  He was started on sepsis protocol and admission requested for further management.   Hospital Course by problem   Sepsis secondary to step pneumonia - sepsis physiology resolving with supportive measures - RESOLVED    Strep pneumonia bacteremia  - treated high dose IV ceftriaxone  - continue supportive measures - repeat BC on 12/24 - no growth to date  - DC home on oral cefdinir to complete course of therapy   Hypokalemia - repleted    AKI - improved - treated with IV hydration    LLL Pneumonia due to strep pneumonia - reports he feels back  to baseline - treated with IV ceftriaxone - DC home on oral cefdinir    Leukocytosis  - resolved  - continue current treatments    Elevated LFTs - LFTs trending down with therapy   Discharge Diagnoses:  Principal Problem:   LLL pneumonia Active Problems:   Leukocytosis   AKI (acute kidney injury) ([place])   Dehydration   RSV (respiratory syncytial virus infection)   Hypokalemia   Elevated LFTs   Bacteremia due to Streptococcus pneumoniae   Discharge Instructions:  Allergies as of 12/22/2022   No Known Allergies      Medication List     TAKE these medications    amphetamine-dextroamphetamine 10 MG tablet Commonly known as: ADDERALL Take 10 mg by mouth 2 (two) times daily.   buPROPion 300 MG 24 hr tablet Commonly known as: WELLBUTRIN XL Take 300 mg by mouth daily.   cefdinir 300 MG capsule Commonly known as: OMNICEF Take 1 capsule (300 mg total) by mouth 2 (two) times daily for 7 days. Start taking on: December 23, 2022   CREATINE PO Take 5 g by mouth daily.   dextromethorphan 30 MG/5ML liquid Commonly known as: DELSYM Take 5 mLs (30 mg total) by mouth 2 (two) times daily as needed for cough.   FLUoxetine 20 MG capsule Commonly known as: PROZAC Take 20 mg by mouth 3 (three) times daily. Patient takes '40mg'$  in the morning and '20mg'$  at night   GINSENG PO Take 300 mg by mouth daily.  guaiFENesin 600 MG 12 hr tablet Commonly known as: MUCINEX Take 2 tablets (1,200 mg total) by mouth 2 (two) times daily for 2 days.   L-ARGININE PO Take 3,000-5,000 mg by mouth daily.   multivitamin with minerals Tabs tablet Take 1 tablet by mouth daily.   PROTEIN PO Take 20 g by mouth daily.   testosterone cypionate 200 MG/ML injection Commonly known as: DEPOTESTOSTERONE CYPIONATE Inject 100 mg into the muscle every 14 (fourteen) days.        Follow-up Information     Caryl Bis, MD. Schedule an appointment as soon as possible for a visit in 1 week(s).    Specialty: Family Medicine Why: Hospital Follow Up Contact information: [place] [id number] [phone]                No Known Allergies Allergies as of 12/22/2022   No Known Allergies      Medication List     TAKE these medications    amphetamine-dextroamphetamine 10 MG tablet Commonly known as: ADDERALL Take 10 mg by mouth 2 (two) times daily.   buPROPion 300 MG 24 hr tablet Commonly known as: WELLBUTRIN XL Take 300 mg by mouth daily.   cefdinir 300 MG capsule Commonly known as: OMNICEF Take 1 capsule (300 mg total) by mouth 2 (two) times daily for 7 days. Start taking on: December 23, 2022   CREATINE PO Take 5 g by mouth daily.   dextromethorphan 30 MG/5ML liquid Commonly known as: DELSYM Take 5 mLs (30 mg total) by mouth 2 (two) times daily as needed for cough.   FLUoxetine 20 MG capsule Commonly known as: PROZAC Take 20 mg by mouth 3 (three) times daily. Patient takes '40mg'$  in the morning and '20mg'$  at night   GINSENG PO Take 300 mg by mouth daily.   guaiFENesin 600 MG 12 hr tablet Commonly known as: MUCINEX Take 2 tablets (1,200 mg total) by mouth 2 (two) times daily for 2 days.   L-ARGININE PO Take 3,000-5,000 mg by mouth daily.   multivitamin with minerals Tabs tablet Take 1 tablet by mouth daily.   PROTEIN PO Take 20 g by mouth daily.   testosterone cypionate 200 MG/ML injection Commonly known as: DEPOTESTOSTERONE CYPIONATE Inject 100 mg into the muscle every 14 (fourteen) days.        Procedures/Studies: DG Chest Port 1 View  Result Date: 12/18/2022 CLINICAL DATA:  SOB cough EXAM: PORTABLE CHEST 1 VIEW COMPARISON:  08/18/2009 FINDINGS: The heart size and mediastinal contours are within normal limits. Left basilar opacity consistent with layering effusion. Left basilar consolidation. Right lung clear. Normal pulmonary vasculature. IMPRESSION: Left base consolidation and pleural effusion Electronically Signed   By:  Sammie Bench [name]   On: 12/18/2022 10:47     Subjective: Pt reports he feels much better and wants to go home today.  No complaints.    Discharge Exam: Vitals:   12/22/22 0430 12/22/22 0744  BP: (!) 141/87   Pulse: 78   Resp: 10   Temp: 98.1 F (36.7 C)   SpO2: 97% 96%   Vitals:   12/21/22 1944 12/21/22 2016 12/22/22 0430 12/22/22 0744  BP: 134/83  (!) 141/87   Pulse: 88  78   Resp: 20  10   Temp: 98.4 F (36.9 C)  98.1 F (36.7 C)   TempSrc: Oral  Oral   SpO2: 95% 96% 97% 96%  Weight:      Height:  General: Pt is alert, awake, not in acute distress Cardiovascular: normal S1/S2 +, no rubs, no gallops Respiratory: CTA bilaterally, no wheezing, no rhonchi Abdominal: Soft, NT, ND, bowel sounds + Extremities: no edema, no cyanosis   The results of significant diagnostics from this hospitalization (including imaging, microbiology, ancillary and laboratory) are listed below for reference.     Microbiology: Recent Results (from the past 240 hour(s))  Resp panel by RT-PCR (RSV, Flu A&B, Covid) Anterior Nasal Swab     Status: Abnormal   Collection Time: 12/18/22 10:35 AM   Specimen: Anterior Nasal Swab  Result Value Ref Range Status   SARS Coronavirus 2 by RT PCR NEGATIVE NEGATIVE Final    Comment: (NOTE) SARS-CoV-2 target nucleic acids are NOT DETECTED.  The SARS-CoV-2 RNA is generally detectable in upper respiratory specimens during the acute phase of infection. The lowest concentration of SARS-CoV-2 viral copies this assay can detect is 138 copies/mL. A negative result does not preclude SARS-Cov-2 infection and should not be used as the sole basis for treatment or other patient management decisions. A negative result may occur with  improper specimen collection/handling, submission of specimen other than nasopharyngeal swab, presence of viral mutation(s) within the areas targeted by this assay, and inadequate number of viral copies(<138 copies/mL). A  negative result must be combined with clinical observations, patient history, and epidemiological information. The expected result is Negative.  Fact Sheet for Patients:  EntrepreneurPulse.com.au  Fact Sheet for Healthcare Providers:  IncredibleEmployment.be  This test is no t yet approved or cleared by the [place] FDA and  has been authorized for detection and/or diagnosis of SARS-CoV-2 by FDA under an Emergency Use Authorization (EUA). This EUA will remain  in effect (meaning this test can be used) for the duration of the COVID-19 declaration under Section 564(b)(1) of the Act, 21 U.S.C.section 360bbb-3(b)(1), unless the authorization is terminated  or revoked sooner.       Influenza A by PCR NEGATIVE NEGATIVE Final   Influenza B by PCR NEGATIVE NEGATIVE Final    Comment: (NOTE) The Xpert Xpress SARS-CoV-2/FLU/RSV plus assay is intended as an aid in the diagnosis of influenza from Nasopharyngeal swab specimens and should not be used as a sole basis for treatment. Nasal washings and aspirates are unacceptable for Xpert Xpress SARS-CoV-2/FLU/RSV testing.  Fact Sheet for Patients: EntrepreneurPulse.com.au  Fact Sheet for Healthcare Providers: IncredibleEmployment.be  This test is not yet approved or cleared by the [place] FDA and has been authorized for detection and/or diagnosis of SARS-CoV-2 by FDA under an Emergency Use Authorization (EUA). This EUA will remain in effect (meaning this test can be used) for the duration of the COVID-19 declaration under Section 564(b)(1) of the Act, 21 U.S.C. section 360bbb-3(b)(1), unless the authorization is terminated or revoked.     Resp Syncytial Virus by PCR POSITIVE (A) NEGATIVE Final    Comment: (NOTE) Fact Sheet for Patients: EntrepreneurPulse.com.au  Fact Sheet for Healthcare  Providers: IncredibleEmployment.be  This test is not yet approved or cleared by the [place] FDA and has been authorized for detection and/or diagnosis of SARS-CoV-2 by FDA under an Emergency Use Authorization (EUA). This EUA will remain in effect (meaning this test can be used) for the duration of the COVID-19 declaration under Section 564(b)(1) of the Act, 21 U.S.C. section 360bbb-3(b)(1), unless the authorization is terminated or revoked.  Performed at [facility], [address], [place], [place] [address]   Blood Culture (routine x 2)     Status: Abnormal  Collection Time: 12/18/22 10:47 AM   Specimen: BLOOD  Result Value Ref Range Status   Specimen Description   Final    BLOOD RIGHT ARM Performed at [facility], [address] [place], [place] [address]    Special Requests   Final    BOTTLES DRAWN AEROBIC AND ANAEROBIC Blood Culture adequate volume Performed at [facility], [address], [place], [place] [address]    Culture  Setup Time   Final    [employer] Gram Stain Report Called to,Read Back By and Verified With: THOMPSON,R @ 2778 ON 12/18/22 BY JUW IN BOTH AEROBIC AND ANAEROBIC BOTTLES GS DONE @ [facility]  CRITICAL RESULT CALLED TO, READ BACK BY AND VERIFIED WITH: T EVANS,RN'@0412'$  12/19/22 [place] Performed at [facility], [place] [address] [place], [place] [address]    Culture STREPTOCOCCUS PNEUMONIAE (A)  Final   Report Status 12/21/2022 FINAL  Final   Organism ID, Bacteria STREPTOCOCCUS PNEUMONIAE  Final      Susceptibility   Streptococcus pneumoniae - MIC*    ERYTHROMYCIN <=0.12 SENSITIVE Sensitive     LEVOFLOXACIN 0.5 SENSITIVE Sensitive     VANCOMYCIN <=0.12 SENSITIVE Sensitive     PENICILLIN (meningitis) <=0.06 SENSITIVE Sensitive     PENO - penicillin <=0.06      PENICILLIN (non-meningitis) <=0.06 SENSITIVE Sensitive     PENICILLIN (oral) <=0.06 SENSITIVE Sensitive     CEFTRIAXONE (non-meningitis) <=0.12 SENSITIVE Sensitive      CEFTRIAXONE (meningitis) <=0.12 SENSITIVE Sensitive     * STREPTOCOCCUS PNEUMONIAE  Blood Culture ID Panel (Reflexed)     Status: Abnormal   Collection Time: 12/18/22 10:47 AM  Result Value Ref Range Status   Enterococcus faecalis NOT DETECTED NOT DETECTED Final   Enterococcus Faecium NOT DETECTED NOT DETECTED Final   Listeria monocytogenes NOT DETECTED NOT DETECTED Final   Staphylococcus species NOT DETECTED NOT DETECTED Final   Staphylococcus aureus (BCID) NOT DETECTED NOT DETECTED Final   Staphylococcus epidermidis NOT DETECTED NOT DETECTED Final   Staphylococcus lugdunensis NOT DETECTED NOT DETECTED Final   Streptococcus species DETECTED (A) NOT DETECTED Final    Comment: CRITICAL RESULT CALLED TO, READ BACK BY AND VERIFIED WITH: T EVANS,RN'@0412'$  12/19/22 [place]    Streptococcus agalactiae NOT DETECTED NOT DETECTED Final   Streptococcus pneumoniae DETECTED (A) NOT DETECTED Final    Comment: CRITICAL RESULT CALLED TO, READ BACK BY AND VERIFIED WITH: T EVANS,RN'@0412'$  12/19/22 [place]    Streptococcus pyogenes NOT DETECTED NOT DETECTED Final   A.calcoaceticus-baumannii NOT DETECTED NOT DETECTED Final   Bacteroides fragilis NOT DETECTED NOT DETECTED Final   Enterobacterales NOT DETECTED NOT DETECTED Final   Enterobacter cloacae complex NOT DETECTED NOT DETECTED Final   Escherichia coli NOT DETECTED NOT DETECTED Final   Klebsiella aerogenes NOT DETECTED NOT DETECTED Final   Klebsiella oxytoca NOT DETECTED NOT DETECTED Final   Klebsiella pneumoniae NOT DETECTED NOT DETECTED Final   Proteus species NOT DETECTED NOT DETECTED Final   Salmonella species NOT DETECTED NOT DETECTED Final   Serratia marcescens NOT DETECTED NOT DETECTED Final   Haemophilus influenzae NOT DETECTED NOT DETECTED Final   Neisseria meningitidis NOT DETECTED NOT DETECTED Final   Pseudomonas aeruginosa NOT DETECTED NOT DETECTED Final   Stenotrophomonas maltophilia NOT DETECTED NOT DETECTED Final   Candida albicans NOT  DETECTED NOT DETECTED Final   Candida auris NOT DETECTED NOT DETECTED Final   Candida glabrata NOT DETECTED NOT DETECTED Final   Candida krusei NOT DETECTED NOT DETECTED Final   Candida parapsilosis  NOT DETECTED NOT DETECTED Final   Candida tropicalis NOT DETECTED NOT DETECTED Final   Cryptococcus neoformans/gattii NOT DETECTED NOT DETECTED Final    Comment: Performed at [facility], [place] [address] [place], [place] [address]  Blood Culture (routine x 2)     Status: None (Preliminary result)   Collection Time: 12/18/22 11:00 AM   Specimen: BLOOD  Result Value Ref Range Status   Specimen Description BLOOD LEFT ASSIST CONTROL  Final   Special Requests   Final    BOTTLES DRAWN AEROBIC AND ANAEROBIC Blood Culture adequate volume   Culture   Final    NO GROWTH 4 DAYS Performed at [facility], [address], [place], [place] [address]    Report Status PENDING  Incomplete  Urine Culture     Status: Abnormal   Collection Time: 12/18/22 12:25 PM   Specimen: Urine, Catheterized  Result Value Ref Range Status   Specimen Description   Final    URINE, CATHETERIZED Performed at [facility], [address], [place], [place] [address]    Special Requests   Final    NONE Performed at [facility], [address] [place], [place] [address]    Culture (A)  Final    <10,000 COLONIES/mL INSIGNIFICANT GROWTH Performed at [facility], [place] [address], [place], [place] [address]    Report Status 12/19/2022 FINAL  Final  Culture, blood (Routine X 2) w Reflex to ID Panel     Status: None (Preliminary result)   Collection Time: 12/20/22 10:09 AM   Specimen: BLOOD LEFT ARM  Result Value Ref Range Status   Specimen Description BLOOD LEFT ARM  Final   Special Requests   Final    BOTTLES DRAWN AEROBIC AND ANAEROBIC Blood Culture results may not be optimal due to an excessive volume of blood received in culture bottles   Culture   Final    NO GROWTH 2 DAYS Performed at [facility], [address], [place], [place] [address]    Report Status PENDING  Incomplete  Culture, blood (Routine X 2) w Reflex to ID Panel     Status: None (Preliminary result)   Collection Time: 12/20/22 10:09 AM   Specimen: BLOOD RIGHT ARM  Result Value Ref Range Status   Specimen Description BLOOD RIGHT ARM  Final   Special Requests   Final    BOTTLES DRAWN AEROBIC AND ANAEROBIC Blood Culture results may not be optimal due to an excessive volume of blood received in culture bottles   Culture   Final    NO GROWTH 2 DAYS Performed at [facility], [address], [place], [place] [address]    Report Status PENDING  Incomplete     Labs: BNP (last 3 results) No results for input(s): "BNP" in the last 8760 hours. Basic Metabolic Panel: Recent Labs  Lab 12/18/22 1047 12/18/22 1420 12/19/22 0612 12/20/22 0359 12/21/22 0449 12/22/22 0338  NA 130*  --  136 134* 136 135  K 3.3*  --  3.3* 3.4* 3.8 4.0  CL 94*  --  102 100 104 103  CO2 25  --  '23 22 24 24  '$ GLUCOSE 125*  --  126* 82 90 97  BUN 25*  --  24* 24* 18 17  CREATININE 1.72*  --  1.46* 1.33* 1.27* 1.29*  CALCIUM 7.8*  --  7.3* 7.4* 7.7* 8.0*  MG  --  2.3 2.6*  --   --  2.2  Liver Function Tests: Recent Labs  Lab 12/18/22 1047 12/19/22 0612 12/20/22 0359 12/21/22 0449  AST 42* 17 26 33  ALT 96* 52* 48* 59*  ALKPHOS 95 97 87 69  BILITOT 0.7 0.4 0.7 0.3  PROT 7.4 6.0* 6.1* 5.9*  ALBUMIN 3.0* 2.2* 2.2* 2.3*   No results for input(s): "LIPASE", "AMYLASE" in the last 168 hours. No results for input(s): "AMMONIA" in the last 168 hours. CBC: Recent Labs  Lab 12/18/22 1047 12/19/22 0612 12/20/22 0359 12/21/22 0449  WBC 23.7* 23.8* 17.7* 9.3  NEUTROABS 21.6* 20.3* 14.8* 6.9  HGB 15.6 12.8* 13.0 13.2  HCT 44.9 36.7* 38.4* 39.0  MCV 88.2 88.2 89.1 89.4  PLT 265 244 293 322   Cardiac Enzymes: No results for input(s): "CKTOTAL", "CKMB", "CKMBINDEX", "TROPONINI" in the last 168 hours. BNP: Invalid input(s):  "POCBNP" CBG: No results for input(s): "GLUCAP" in the last 168 hours. D-Dimer No results for input(s): "DDIMER" in the last 72 hours. Hgb A1c No results for input(s): "HGBA1C" in the last 72 hours. Lipid Profile No results for input(s): "CHOL", "HDL", "LDLCALC", "TRIG", "CHOLHDL", "LDLDIRECT" in the last 72 hours. Thyroid function studies No results for input(s): "TSH", "T4TOTAL", "T3FREE", "THYROIDAB" in the last 72 hours.  Invalid input(s): "FREET3" Anemia work up No results for input(s): "VITAMINB12", "FOLATE", "FERRITIN", "TIBC", "IRON", "RETICCTPCT" in the last 72 hours. Urinalysis    Component Value Date/Time   COLORURINE YELLOW 12/18/2022 1225   APPEARANCEUR HAZY (A) 12/18/2022 1225   LABSPEC 1.027 12/18/2022 1225   PHURINE 6.0 12/18/2022 1225   GLUCOSEU NEGATIVE 12/18/2022 1225   HGBUR MODERATE (A) 12/18/2022 1225   BILIRUBINUR NEGATIVE 12/18/2022 1225   KETONESUR 5 (A) 12/18/2022 1225   PROTEINUR 100 (A) 12/18/2022 1225   UROBILINOGEN 0.2 04/24/2015 1945   NITRITE NEGATIVE 12/18/2022 1225   LEUKOCYTESUR NEGATIVE 12/18/2022 1225   Sepsis Labs Recent Labs  Lab 12/18/22 1047 12/19/22 0612 12/20/22 0359 12/21/22 0449  WBC 23.7* 23.8* 17.7* 9.3   Microbiology Recent Results (from the past 240 hour(s))  Resp panel by RT-PCR (RSV, Flu A&B, Covid) Anterior Nasal Swab     Status: Abnormal   Collection Time: 12/18/22 10:35 AM   Specimen: Anterior Nasal Swab  Result Value Ref Range Status   SARS Coronavirus 2 by RT PCR NEGATIVE NEGATIVE Final    Comment: (NOTE) SARS-CoV-2 target nucleic acids are NOT DETECTED.  The SARS-CoV-2 RNA is generally detectable in upper respiratory specimens during the acute phase of infection. The lowest concentration of SARS-CoV-2 viral copies this assay can detect is 138 copies/mL. A negative result does not preclude SARS-Cov-2 infection and should not be used as the sole basis for treatment or other patient management decisions. A  negative result may occur with  improper specimen collection/handling, submission of specimen other than nasopharyngeal swab, presence of viral mutation(s) within the areas targeted by this assay, and inadequate number of viral copies(<138 copies/mL). A negative result must be combined with clinical observations, patient history, and epidemiological information. The expected result is Negative.  Fact Sheet for Patients:  EntrepreneurPulse.com.au  Fact Sheet for Healthcare Providers:  IncredibleEmployment.be  This test is no t yet approved or cleared by the [place] FDA and  has been authorized for detection and/or diagnosis of SARS-CoV-2 by FDA under an Emergency Use Authorization (EUA). This EUA will remain  in effect (meaning this test can be used) for the duration of the COVID-19 declaration under Section 564(b)(1) of the Act, 21 U.S.C.section 360bbb-3(b)(1), unless the authorization  is terminated  or revoked sooner.       Influenza A by PCR NEGATIVE NEGATIVE Final   Influenza B by PCR NEGATIVE NEGATIVE Final    Comment: (NOTE) The Xpert Xpress SARS-CoV-2/FLU/RSV plus assay is intended as an aid in the diagnosis of influenza from Nasopharyngeal swab specimens and should not be used as a sole basis for treatment. Nasal washings and aspirates are unacceptable for Xpert Xpress SARS-CoV-2/FLU/RSV testing.  Fact Sheet for Patients: EntrepreneurPulse.com.au  Fact Sheet for Healthcare Providers: IncredibleEmployment.be  This test is not yet approved or cleared by the [place] FDA and has been authorized for detection and/or diagnosis of SARS-CoV-2 by FDA under an Emergency Use Authorization (EUA). This EUA will remain in effect (meaning this test can be used) for the duration of the COVID-19 declaration under Section 564(b)(1) of the Act, 21 U.S.C. section 360bbb-3(b)(1), unless the authorization  is terminated or revoked.     Resp Syncytial Virus by PCR POSITIVE (A) NEGATIVE Final    Comment: (NOTE) Fact Sheet for Patients: EntrepreneurPulse.com.au  Fact Sheet for Healthcare Providers: IncredibleEmployment.be  This test is not yet approved or cleared by the [place] FDA and has been authorized for detection and/or diagnosis of SARS-CoV-2 by FDA under an Emergency Use Authorization (EUA). This EUA will remain in effect (meaning this test can be used) for the duration of the COVID-19 declaration under Section 564(b)(1) of the Act, 21 U.S.C. section 360bbb-3(b)(1), unless the authorization is terminated or revoked.  Performed at [facility], [address], [place], [place] [address]   Blood Culture (routine x 2)     Status: Abnormal   Collection Time: 12/18/22 10:47 AM   Specimen: BLOOD  Result Value Ref Range Status   Specimen Description   Final    BLOOD RIGHT ARM Performed at [facility], [address], [place], [place] [address]    Special Requests   Final    BOTTLES DRAWN AEROBIC AND ANAEROBIC Blood Culture adequate volume Performed at [facility], [address] [place], [place] [address]    Culture  Setup Time   Final    [place] Gram Stain Report Called to,Read Back By and Verified With: THOMPSON,R @ 6073 ON 12/18/22 BY JUW IN BOTH AEROBIC AND ANAEROBIC BOTTLES GS DONE @ [facility]  CRITICAL RESULT CALLED TO, READ BACK BY AND VERIFIED WITH: T EVANS,RN'@0412'$  12/19/22 [place] Performed at [facility], [place] [address], [place], [place] [address]    Culture STREPTOCOCCUS PNEUMONIAE (A)  Final   Report Status 12/21/2022 FINAL  Final   Organism ID, Bacteria STREPTOCOCCUS PNEUMONIAE  Final      Susceptibility   Streptococcus pneumoniae - MIC*    ERYTHROMYCIN <=0.12 SENSITIVE Sensitive     LEVOFLOXACIN 0.5 SENSITIVE Sensitive     VANCOMYCIN <=0.12 SENSITIVE Sensitive     PENICILLIN (meningitis) <=0.06 SENSITIVE Sensitive     PENO  - penicillin <=0.06      PENICILLIN (non-meningitis) <=0.06 SENSITIVE Sensitive     PENICILLIN (oral) <=0.06 SENSITIVE Sensitive     CEFTRIAXONE (non-meningitis) <=0.12 SENSITIVE Sensitive     CEFTRIAXONE (meningitis) <=0.12 SENSITIVE Sensitive     * STREPTOCOCCUS PNEUMONIAE  Blood Culture ID Panel (Reflexed)     Status: Abnormal   Collection Time: 12/18/22 10:47 AM  Result Value Ref Range Status   Enterococcus faecalis NOT DETECTED NOT DETECTED Final   Enterococcus Faecium NOT DETECTED NOT DETECTED Final   Listeria monocytogenes NOT DETECTED NOT DETECTED Final   Staphylococcus species  NOT DETECTED NOT DETECTED Final   Staphylococcus aureus (BCID) NOT DETECTED NOT DETECTED Final   Staphylococcus epidermidis NOT DETECTED NOT DETECTED Final   Staphylococcus lugdunensis NOT DETECTED NOT DETECTED Final   Streptococcus species DETECTED (A) NOT DETECTED Final    Comment: CRITICAL RESULT CALLED TO, READ BACK BY AND VERIFIED WITH: T EVANS,RN'@0412'$  12/19/22 [place]    Streptococcus agalactiae NOT DETECTED NOT DETECTED Final   Streptococcus pneumoniae DETECTED (A) NOT DETECTED Final    Comment: CRITICAL RESULT CALLED TO, READ BACK BY AND VERIFIED WITH: T EVANS,RN'@[id number]'$  12/19/22 [place]    Streptococcus pyogenes NOT DETECTED NOT DETECTED Final   A.calcoaceticus-baumannii NOT DETECTED NOT DETECTED Final   Bacteroides fragilis NOT DETECTED NOT DETECTED Final   Enterobacterales NOT DETECTED NOT DETECTED Final   Enterobacter cloacae complex NOT DETECTED NOT DETECTED Final   Escherichia coli NOT DETECTED NOT DETECTED Final   Klebsiella aerogenes NOT DETECTED NOT DETECTED Final   Klebsiella oxytoca NOT DETECTED NOT DETECTED Final   Klebsiella pneumoniae NOT DETECTED NOT DETECTED Final   Proteus species NOT DETECTED NOT DETECTED Final   Salmonella species NOT DETECTED NOT DETECTED Final   Serratia marcescens NOT DETECTED NOT DETECTED Final   Haemophilus influenzae NOT DETECTED NOT DETECTED Final   Neisseria  meningitidis NOT DETECTED NOT DETECTED Final   Pseudomonas aeruginosa NOT DETECTED NOT DETECTED Final   Stenotrophomonas maltophilia NOT DETECTED NOT DETECTED Final   Candida albicans NOT DETECTED NOT DETECTED Final   Candida auris NOT DETECTED NOT DETECTED Final   Candida glabrata NOT DETECTED NOT DETECTED Final   Candida krusei NOT DETECTED NOT DETECTED Final   Candida parapsilosis NOT DETECTED NOT DETECTED Final   Candida tropicalis NOT DETECTED NOT DETECTED Final   Cryptococcus neoformans/gattii NOT DETECTED NOT DETECTED Final    Comment: Performed at [facility], [place]. [address], [place], [place] [address]  Blood Culture (routine x 2)     Status: None (Preliminary result)   Collection Time: 12/18/22 11:00 AM   Specimen: BLOOD  Result Value Ref Range Status   Specimen Description BLOOD LEFT ASSIST CONTROL  Final   Special Requests   Final    BOTTLES DRAWN AEROBIC AND ANAEROBIC Blood Culture adequate volume   Culture   Final    NO GROWTH 4 DAYS Performed at [facility], [address], [place], [place] [address]    Report Status PENDING  Incomplete  Urine Culture     Status: Abnormal   Collection Time: 12/18/22 12:25 PM   Specimen: Urine, Catheterized  Result Value Ref Range Status   Specimen Description   Final    URINE, CATHETERIZED Performed at [facility], [address], [place], [place] [address]    Special Requests   Final    NONE Performed at [facility], [address] [place], [place] [address]    Culture (A)  Final    <10,000 COLONIES/mL INSIGNIFICANT GROWTH Performed at [facility], [place] [address] [place],  [address]    Report Status 12/19/2022 FINAL  Final  Culture, blood (Routine X 2) w Reflex to ID Panel     Status: None (Preliminary result)   Collection Time: 12/20/22 10:09 AM   Specimen: BLOOD LEFT ARM  Result Value Ref Range Status   Specimen Description BLOOD LEFT ARM  Final   Special Requests   Final    BOTTLES DRAWN AEROBIC  AND ANAEROBIC Blood Culture results may not be optimal due to an excessive volume of blood  received in culture bottles   Culture   Final    NO GROWTH 2 DAYS Performed at [facility], [address] [place], [place] [address]    Report Status PENDING  Incomplete  Culture, blood (Routine X 2) w Reflex to ID Panel     Status: None (Preliminary result)   Collection Time: 12/20/22 10:09 AM   Specimen: BLOOD RIGHT ARM  Result Value Ref Range Status   Specimen Description BLOOD RIGHT ARM  Final   Special Requests   Final    BOTTLES DRAWN AEROBIC AND ANAEROBIC Blood Culture results may not be optimal due to an excessive volume of blood received in culture bottles   Culture   Final    NO GROWTH 2 DAYS Performed at [facility], [address] [place], [place] [address]    Report Status PENDING  Incomplete   Time coordinating discharge: 35 mins   SIGNED:  Irwin Brakeman, MD  [facility] 12/22/2022, 11:08 AM How to contact the [facility] Attending or Consulting provider [place] or covering provider during after hours [place], for this patient?  Check the care team in [facility] and look for a) attending/consulting [facility] provider listed and b) the [facility] team listed Log into [email or web address] and use [place]'s universal password to access. If you do not have the password, please contact the hospital operator. Locate the [facility] provider you are looking for under [facility] and page to a number that you can be directly reached. If you still have difficulty reaching the provider, please page the [facility] (Director on Call) for the Hospitalists listed on amion for assistance.

## 2022-12-23 LAB — CULTURE, BLOOD (ROUTINE X 2)
Culture: NO GROWTH
Special Requests: ADEQUATE

## 2022-12-23 LAB — LEGIONELLA PNEUMOPHILA SEROGP 1 UR AG: L. pneumophila Serogp 1 Ur Ag: NEGATIVE

## 2022-12-25 LAB — CULTURE, BLOOD (ROUTINE X 2)
Culture: NO GROWTH
Culture: NO GROWTH

## 2022-12-29 DIAGNOSIS — I10 Essential (primary) hypertension: Secondary | ICD-10-CM | POA: Diagnosis not present

## 2022-12-29 DIAGNOSIS — N179 Acute kidney failure, unspecified: Secondary | ICD-10-CM | POA: Diagnosis not present

## 2022-12-29 DIAGNOSIS — E876 Hypokalemia: Secondary | ICD-10-CM | POA: Diagnosis not present

## 2023-01-12 DIAGNOSIS — J181 Lobar pneumonia, unspecified organism: Secondary | ICD-10-CM | POA: Diagnosis not present

## 2023-01-12 DIAGNOSIS — E876 Hypokalemia: Secondary | ICD-10-CM | POA: Diagnosis not present

## 2023-01-14 DIAGNOSIS — E291 Testicular hypofunction: Secondary | ICD-10-CM | POA: Diagnosis not present

## 2023-01-14 DIAGNOSIS — R030 Elevated blood-pressure reading, without diagnosis of hypertension: Secondary | ICD-10-CM | POA: Diagnosis not present

## 2023-01-21 DIAGNOSIS — Z6825 Body mass index (BMI) 25.0-25.9, adult: Secondary | ICD-10-CM | POA: Diagnosis not present

## 2023-01-21 DIAGNOSIS — J181 Lobar pneumonia, unspecified organism: Secondary | ICD-10-CM | POA: Diagnosis not present

## 2023-01-21 DIAGNOSIS — E876 Hypokalemia: Secondary | ICD-10-CM | POA: Diagnosis not present

## 2023-01-28 DIAGNOSIS — R030 Elevated blood-pressure reading, without diagnosis of hypertension: Secondary | ICD-10-CM | POA: Diagnosis not present

## 2023-01-28 DIAGNOSIS — E291 Testicular hypofunction: Secondary | ICD-10-CM | POA: Diagnosis not present

## 2023-02-11 DIAGNOSIS — E291 Testicular hypofunction: Secondary | ICD-10-CM | POA: Diagnosis not present

## 2023-02-11 DIAGNOSIS — R030 Elevated blood-pressure reading, without diagnosis of hypertension: Secondary | ICD-10-CM | POA: Diagnosis not present

## 2023-02-25 DIAGNOSIS — E291 Testicular hypofunction: Secondary | ICD-10-CM | POA: Diagnosis not present

## 2023-02-25 DIAGNOSIS — R030 Elevated blood-pressure reading, without diagnosis of hypertension: Secondary | ICD-10-CM | POA: Diagnosis not present

## 2023-03-03 DIAGNOSIS — N183 Chronic kidney disease, stage 3 unspecified: Secondary | ICD-10-CM | POA: Diagnosis not present

## 2023-03-03 DIAGNOSIS — E291 Testicular hypofunction: Secondary | ICD-10-CM | POA: Diagnosis not present

## 2023-03-03 DIAGNOSIS — E7849 Other hyperlipidemia: Secondary | ICD-10-CM | POA: Diagnosis not present

## 2023-03-03 DIAGNOSIS — E782 Mixed hyperlipidemia: Secondary | ICD-10-CM | POA: Diagnosis not present

## 2023-03-09 DIAGNOSIS — R69 Illness, unspecified: Secondary | ICD-10-CM | POA: Diagnosis not present

## 2023-03-09 DIAGNOSIS — E7849 Other hyperlipidemia: Secondary | ICD-10-CM | POA: Diagnosis not present

## 2023-03-09 DIAGNOSIS — Z9189 Other specified personal risk factors, not elsewhere classified: Secondary | ICD-10-CM | POA: Diagnosis not present

## 2023-03-09 DIAGNOSIS — Z6826 Body mass index (BMI) 26.0-26.9, adult: Secondary | ICD-10-CM | POA: Diagnosis not present

## 2023-03-09 DIAGNOSIS — N1831 Chronic kidney disease, stage 3a: Secondary | ICD-10-CM | POA: Diagnosis not present

## 2023-03-09 DIAGNOSIS — F900 Attention-deficit hyperactivity disorder, predominantly inattentive type: Secondary | ICD-10-CM | POA: Diagnosis not present

## 2023-03-09 DIAGNOSIS — F331 Major depressive disorder, recurrent, moderate: Secondary | ICD-10-CM | POA: Diagnosis not present

## 2023-03-09 DIAGNOSIS — F5221 Male erectile disorder: Secondary | ICD-10-CM | POA: Diagnosis not present

## 2023-03-09 DIAGNOSIS — I10 Essential (primary) hypertension: Secondary | ICD-10-CM | POA: Diagnosis not present

## 2023-03-09 DIAGNOSIS — R4582 Worries: Secondary | ICD-10-CM | POA: Diagnosis not present

## 2023-03-18 DIAGNOSIS — E291 Testicular hypofunction: Secondary | ICD-10-CM | POA: Diagnosis not present

## 2023-03-18 DIAGNOSIS — R030 Elevated blood-pressure reading, without diagnosis of hypertension: Secondary | ICD-10-CM | POA: Diagnosis not present

## 2023-04-02 DIAGNOSIS — E291 Testicular hypofunction: Secondary | ICD-10-CM | POA: Diagnosis not present

## 2023-04-02 DIAGNOSIS — R030 Elevated blood-pressure reading, without diagnosis of hypertension: Secondary | ICD-10-CM | POA: Diagnosis not present

## 2023-04-09 DIAGNOSIS — J029 Acute pharyngitis, unspecified: Secondary | ICD-10-CM | POA: Diagnosis not present

## 2023-04-09 DIAGNOSIS — Z6826 Body mass index (BMI) 26.0-26.9, adult: Secondary | ICD-10-CM | POA: Diagnosis not present

## 2023-04-09 DIAGNOSIS — Z20828 Contact with and (suspected) exposure to other viral communicable diseases: Secondary | ICD-10-CM | POA: Diagnosis not present

## 2023-04-09 DIAGNOSIS — J301 Allergic rhinitis due to pollen: Secondary | ICD-10-CM | POA: Diagnosis not present

## 2023-04-09 DIAGNOSIS — R030 Elevated blood-pressure reading, without diagnosis of hypertension: Secondary | ICD-10-CM | POA: Diagnosis not present

## 2023-04-15 DIAGNOSIS — E291 Testicular hypofunction: Secondary | ICD-10-CM | POA: Diagnosis not present

## 2023-04-15 DIAGNOSIS — R030 Elevated blood-pressure reading, without diagnosis of hypertension: Secondary | ICD-10-CM | POA: Diagnosis not present

## 2023-04-29 DIAGNOSIS — Z6825 Body mass index (BMI) 25.0-25.9, adult: Secondary | ICD-10-CM | POA: Diagnosis not present

## 2023-04-29 DIAGNOSIS — H6692 Otitis media, unspecified, left ear: Secondary | ICD-10-CM | POA: Diagnosis not present

## 2023-04-29 DIAGNOSIS — J019 Acute sinusitis, unspecified: Secondary | ICD-10-CM | POA: Diagnosis not present

## 2023-04-29 DIAGNOSIS — R030 Elevated blood-pressure reading, without diagnosis of hypertension: Secondary | ICD-10-CM | POA: Diagnosis not present

## 2023-04-30 DIAGNOSIS — R030 Elevated blood-pressure reading, without diagnosis of hypertension: Secondary | ICD-10-CM | POA: Diagnosis not present

## 2023-04-30 DIAGNOSIS — E291 Testicular hypofunction: Secondary | ICD-10-CM | POA: Diagnosis not present

## 2023-05-14 DIAGNOSIS — R030 Elevated blood-pressure reading, without diagnosis of hypertension: Secondary | ICD-10-CM | POA: Diagnosis not present

## 2023-05-14 DIAGNOSIS — E291 Testicular hypofunction: Secondary | ICD-10-CM | POA: Diagnosis not present

## 2023-05-28 DIAGNOSIS — R030 Elevated blood-pressure reading, without diagnosis of hypertension: Secondary | ICD-10-CM | POA: Diagnosis not present

## 2023-05-28 DIAGNOSIS — E291 Testicular hypofunction: Secondary | ICD-10-CM | POA: Diagnosis not present

## 2023-06-07 DIAGNOSIS — N1831 Chronic kidney disease, stage 3a: Secondary | ICD-10-CM | POA: Diagnosis not present

## 2023-06-07 DIAGNOSIS — R7303 Prediabetes: Secondary | ICD-10-CM | POA: Diagnosis not present

## 2023-06-07 DIAGNOSIS — E7849 Other hyperlipidemia: Secondary | ICD-10-CM | POA: Diagnosis not present

## 2023-06-07 DIAGNOSIS — Z1329 Encounter for screening for other suspected endocrine disorder: Secondary | ICD-10-CM | POA: Diagnosis not present

## 2023-06-14 DIAGNOSIS — F331 Major depressive disorder, recurrent, moderate: Secondary | ICD-10-CM | POA: Diagnosis not present

## 2023-06-14 DIAGNOSIS — F900 Attention-deficit hyperactivity disorder, predominantly inattentive type: Secondary | ICD-10-CM | POA: Diagnosis not present

## 2023-06-14 DIAGNOSIS — N1831 Chronic kidney disease, stage 3a: Secondary | ICD-10-CM | POA: Diagnosis not present

## 2023-06-14 DIAGNOSIS — R4582 Worries: Secondary | ICD-10-CM | POA: Diagnosis not present

## 2023-06-14 DIAGNOSIS — E7849 Other hyperlipidemia: Secondary | ICD-10-CM | POA: Diagnosis not present

## 2023-06-14 DIAGNOSIS — Z6824 Body mass index (BMI) 24.0-24.9, adult: Secondary | ICD-10-CM | POA: Diagnosis not present

## 2023-06-14 DIAGNOSIS — I10 Essential (primary) hypertension: Secondary | ICD-10-CM | POA: Diagnosis not present

## 2023-06-14 DIAGNOSIS — F5221 Male erectile disorder: Secondary | ICD-10-CM | POA: Diagnosis not present

## 2023-06-21 DIAGNOSIS — E291 Testicular hypofunction: Secondary | ICD-10-CM | POA: Diagnosis not present

## 2023-06-21 DIAGNOSIS — R030 Elevated blood-pressure reading, without diagnosis of hypertension: Secondary | ICD-10-CM | POA: Diagnosis not present

## 2023-06-28 DIAGNOSIS — E291 Testicular hypofunction: Secondary | ICD-10-CM | POA: Diagnosis not present

## 2023-06-28 DIAGNOSIS — R030 Elevated blood-pressure reading, without diagnosis of hypertension: Secondary | ICD-10-CM | POA: Diagnosis not present

## 2023-07-06 DIAGNOSIS — E291 Testicular hypofunction: Secondary | ICD-10-CM | POA: Diagnosis not present

## 2023-07-06 DIAGNOSIS — R030 Elevated blood-pressure reading, without diagnosis of hypertension: Secondary | ICD-10-CM | POA: Diagnosis not present

## 2023-07-13 DIAGNOSIS — E291 Testicular hypofunction: Secondary | ICD-10-CM | POA: Diagnosis not present

## 2023-07-13 DIAGNOSIS — R030 Elevated blood-pressure reading, without diagnosis of hypertension: Secondary | ICD-10-CM | POA: Diagnosis not present

## 2023-07-20 DIAGNOSIS — R030 Elevated blood-pressure reading, without diagnosis of hypertension: Secondary | ICD-10-CM | POA: Diagnosis not present

## 2023-07-20 DIAGNOSIS — E291 Testicular hypofunction: Secondary | ICD-10-CM | POA: Diagnosis not present

## 2023-07-27 DIAGNOSIS — R030 Elevated blood-pressure reading, without diagnosis of hypertension: Secondary | ICD-10-CM | POA: Diagnosis not present

## 2023-07-27 DIAGNOSIS — E291 Testicular hypofunction: Secondary | ICD-10-CM | POA: Diagnosis not present

## 2023-08-05 DIAGNOSIS — E291 Testicular hypofunction: Secondary | ICD-10-CM | POA: Diagnosis not present

## 2023-08-05 DIAGNOSIS — R030 Elevated blood-pressure reading, without diagnosis of hypertension: Secondary | ICD-10-CM | POA: Diagnosis not present

## 2023-08-12 DIAGNOSIS — R030 Elevated blood-pressure reading, without diagnosis of hypertension: Secondary | ICD-10-CM | POA: Diagnosis not present

## 2023-08-12 DIAGNOSIS — E291 Testicular hypofunction: Secondary | ICD-10-CM | POA: Diagnosis not present

## 2023-08-19 DIAGNOSIS — E291 Testicular hypofunction: Secondary | ICD-10-CM | POA: Diagnosis not present

## 2023-08-19 DIAGNOSIS — R030 Elevated blood-pressure reading, without diagnosis of hypertension: Secondary | ICD-10-CM | POA: Diagnosis not present

## 2023-08-26 DIAGNOSIS — E291 Testicular hypofunction: Secondary | ICD-10-CM | POA: Diagnosis not present

## 2023-08-26 DIAGNOSIS — R030 Elevated blood-pressure reading, without diagnosis of hypertension: Secondary | ICD-10-CM | POA: Diagnosis not present

## 2023-09-02 DIAGNOSIS — R030 Elevated blood-pressure reading, without diagnosis of hypertension: Secondary | ICD-10-CM | POA: Diagnosis not present

## 2023-09-02 DIAGNOSIS — E291 Testicular hypofunction: Secondary | ICD-10-CM | POA: Diagnosis not present

## 2023-09-09 DIAGNOSIS — E291 Testicular hypofunction: Secondary | ICD-10-CM | POA: Diagnosis not present

## 2023-09-09 DIAGNOSIS — R030 Elevated blood-pressure reading, without diagnosis of hypertension: Secondary | ICD-10-CM | POA: Diagnosis not present

## 2023-09-16 DIAGNOSIS — R030 Elevated blood-pressure reading, without diagnosis of hypertension: Secondary | ICD-10-CM | POA: Diagnosis not present

## 2023-09-16 DIAGNOSIS — E291 Testicular hypofunction: Secondary | ICD-10-CM | POA: Diagnosis not present

## 2023-09-20 DIAGNOSIS — N1831 Chronic kidney disease, stage 3a: Secondary | ICD-10-CM | POA: Diagnosis not present

## 2023-09-20 DIAGNOSIS — E291 Testicular hypofunction: Secondary | ICD-10-CM | POA: Diagnosis not present

## 2023-09-20 DIAGNOSIS — E782 Mixed hyperlipidemia: Secondary | ICD-10-CM | POA: Diagnosis not present

## 2023-09-20 DIAGNOSIS — I10 Essential (primary) hypertension: Secondary | ICD-10-CM | POA: Diagnosis not present

## 2023-09-20 DIAGNOSIS — E7849 Other hyperlipidemia: Secondary | ICD-10-CM | POA: Diagnosis not present

## 2023-09-20 DIAGNOSIS — Z1329 Encounter for screening for other suspected endocrine disorder: Secondary | ICD-10-CM | POA: Diagnosis not present

## 2023-09-27 DIAGNOSIS — R4582 Worries: Secondary | ICD-10-CM | POA: Diagnosis not present

## 2023-09-27 DIAGNOSIS — E782 Mixed hyperlipidemia: Secondary | ICD-10-CM | POA: Diagnosis not present

## 2023-09-27 DIAGNOSIS — F5221 Male erectile disorder: Secondary | ICD-10-CM | POA: Diagnosis not present

## 2023-09-27 DIAGNOSIS — F900 Attention-deficit hyperactivity disorder, predominantly inattentive type: Secondary | ICD-10-CM | POA: Diagnosis not present

## 2023-09-27 DIAGNOSIS — I10 Essential (primary) hypertension: Secondary | ICD-10-CM | POA: Diagnosis not present

## 2023-09-27 DIAGNOSIS — Z23 Encounter for immunization: Secondary | ICD-10-CM | POA: Diagnosis not present

## 2023-09-27 DIAGNOSIS — F331 Major depressive disorder, recurrent, moderate: Secondary | ICD-10-CM | POA: Diagnosis not present

## 2023-09-27 DIAGNOSIS — N1831 Chronic kidney disease, stage 3a: Secondary | ICD-10-CM | POA: Diagnosis not present

## 2023-09-27 DIAGNOSIS — Z6826 Body mass index (BMI) 26.0-26.9, adult: Secondary | ICD-10-CM | POA: Diagnosis not present

## 2023-09-30 DIAGNOSIS — E291 Testicular hypofunction: Secondary | ICD-10-CM | POA: Diagnosis not present

## 2023-10-14 DIAGNOSIS — E291 Testicular hypofunction: Secondary | ICD-10-CM | POA: Diagnosis not present

## 2023-10-14 DIAGNOSIS — R030 Elevated blood-pressure reading, without diagnosis of hypertension: Secondary | ICD-10-CM | POA: Diagnosis not present

## 2023-10-28 DIAGNOSIS — R030 Elevated blood-pressure reading, without diagnosis of hypertension: Secondary | ICD-10-CM | POA: Diagnosis not present

## 2023-10-28 DIAGNOSIS — E291 Testicular hypofunction: Secondary | ICD-10-CM | POA: Diagnosis not present

## 2023-12-02 DIAGNOSIS — R030 Elevated blood-pressure reading, without diagnosis of hypertension: Secondary | ICD-10-CM | POA: Diagnosis not present

## 2023-12-02 DIAGNOSIS — E291 Testicular hypofunction: Secondary | ICD-10-CM | POA: Diagnosis not present

## 2024-01-12 DIAGNOSIS — E291 Testicular hypofunction: Secondary | ICD-10-CM | POA: Diagnosis not present

## 2024-01-26 DIAGNOSIS — N529 Male erectile dysfunction, unspecified: Secondary | ICD-10-CM | POA: Diagnosis not present

## 2024-01-26 DIAGNOSIS — E291 Testicular hypofunction: Secondary | ICD-10-CM | POA: Diagnosis not present

## 2024-02-09 DIAGNOSIS — F5221 Male erectile disorder: Secondary | ICD-10-CM | POA: Diagnosis not present

## 2024-02-09 DIAGNOSIS — E291 Testicular hypofunction: Secondary | ICD-10-CM | POA: Diagnosis not present

## 2024-02-23 DIAGNOSIS — E291 Testicular hypofunction: Secondary | ICD-10-CM | POA: Diagnosis not present

## 2024-02-23 DIAGNOSIS — N529 Male erectile dysfunction, unspecified: Secondary | ICD-10-CM | POA: Diagnosis not present

## 2024-03-08 DIAGNOSIS — E291 Testicular hypofunction: Secondary | ICD-10-CM | POA: Diagnosis not present

## 2024-03-22 DIAGNOSIS — E291 Testicular hypofunction: Secondary | ICD-10-CM | POA: Diagnosis not present

## 2024-04-03 DIAGNOSIS — E291 Testicular hypofunction: Secondary | ICD-10-CM | POA: Diagnosis not present

## 2024-04-03 DIAGNOSIS — N529 Male erectile dysfunction, unspecified: Secondary | ICD-10-CM | POA: Diagnosis not present

## 2024-04-05 DIAGNOSIS — E291 Testicular hypofunction: Secondary | ICD-10-CM | POA: Diagnosis not present

## 2024-04-07 DIAGNOSIS — N1831 Chronic kidney disease, stage 3a: Secondary | ICD-10-CM | POA: Diagnosis not present

## 2024-04-07 DIAGNOSIS — I10 Essential (primary) hypertension: Secondary | ICD-10-CM | POA: Diagnosis not present

## 2024-04-07 DIAGNOSIS — E291 Testicular hypofunction: Secondary | ICD-10-CM | POA: Diagnosis not present

## 2024-04-07 DIAGNOSIS — Z6825 Body mass index (BMI) 25.0-25.9, adult: Secondary | ICD-10-CM | POA: Diagnosis not present

## 2024-04-07 DIAGNOSIS — E782 Mixed hyperlipidemia: Secondary | ICD-10-CM | POA: Diagnosis not present

## 2024-04-19 DIAGNOSIS — E291 Testicular hypofunction: Secondary | ICD-10-CM | POA: Diagnosis not present

## 2024-05-03 DIAGNOSIS — E291 Testicular hypofunction: Secondary | ICD-10-CM | POA: Diagnosis not present

## 2024-05-17 DIAGNOSIS — E291 Testicular hypofunction: Secondary | ICD-10-CM | POA: Diagnosis not present

## 2024-05-31 DIAGNOSIS — E291 Testicular hypofunction: Secondary | ICD-10-CM | POA: Diagnosis not present

## 2024-06-14 DIAGNOSIS — E291 Testicular hypofunction: Secondary | ICD-10-CM | POA: Diagnosis not present

## 2024-06-28 DIAGNOSIS — E291 Testicular hypofunction: Secondary | ICD-10-CM | POA: Diagnosis not present

## 2024-07-12 DIAGNOSIS — E291 Testicular hypofunction: Secondary | ICD-10-CM | POA: Diagnosis not present

## 2024-07-26 DIAGNOSIS — E291 Testicular hypofunction: Secondary | ICD-10-CM | POA: Diagnosis not present

## 2024-08-30 DIAGNOSIS — E291 Testicular hypofunction: Secondary | ICD-10-CM | POA: Diagnosis not present

## 2024-09-13 DIAGNOSIS — E291 Testicular hypofunction: Secondary | ICD-10-CM | POA: Diagnosis not present

## 2024-10-04 DIAGNOSIS — E291 Testicular hypofunction: Secondary | ICD-10-CM | POA: Diagnosis not present

## 2024-10-18 DIAGNOSIS — E291 Testicular hypofunction: Secondary | ICD-10-CM | POA: Diagnosis not present

## 2024-10-31 DIAGNOSIS — E291 Testicular hypofunction: Secondary | ICD-10-CM | POA: Diagnosis not present

## 2024-11-14 DIAGNOSIS — N529 Male erectile dysfunction, unspecified: Secondary | ICD-10-CM | POA: Diagnosis not present

## 2024-11-14 DIAGNOSIS — E291 Testicular hypofunction: Secondary | ICD-10-CM | POA: Diagnosis not present
